# Patient Record
Sex: Male | Born: 1965
Health system: Southern US, Community
[De-identification: ages and names within clinical notes are randomized; demographics above are authoritative.]

## PROBLEM LIST (undated history)

## (undated) DIAGNOSIS — R809 Proteinuria, unspecified: Secondary | ICD-10-CM

## (undated) DIAGNOSIS — I10 Essential (primary) hypertension: Secondary | ICD-10-CM

## (undated) DIAGNOSIS — D649 Anemia, unspecified: Secondary | ICD-10-CM

## (undated) DIAGNOSIS — R319 Hematuria, unspecified: Secondary | ICD-10-CM

## (undated) DIAGNOSIS — A159 Respiratory tuberculosis unspecified: Secondary | ICD-10-CM

## (undated) DIAGNOSIS — E213 Hyperparathyroidism, unspecified: Secondary | ICD-10-CM

## (undated) DIAGNOSIS — E119 Type 2 diabetes mellitus without complications: Secondary | ICD-10-CM

## (undated) DIAGNOSIS — E785 Hyperlipidemia, unspecified: Secondary | ICD-10-CM

## (undated) DIAGNOSIS — N189 Chronic kidney disease, unspecified: Secondary | ICD-10-CM

## (undated) HISTORY — DX: Chronic kidney disease, unspecified: N18.9

## (undated) HISTORY — PX: CYST EXCISION: SHX5701

## (undated) HISTORY — DX: Type 2 diabetes mellitus without complications: E11.9

## (undated) HISTORY — PX: COLONOSCOPY: SHX174

## (undated) HISTORY — DX: Essential (primary) hypertension: I10

## (undated) HISTORY — DX: Proteinuria, unspecified: R80.9

## (undated) HISTORY — DX: Anemia, unspecified: D64.9

## (undated) HISTORY — DX: Hyperlipidemia, unspecified: E78.5

## (undated) HISTORY — DX: Hematuria, unspecified: R31.9

## (undated) HISTORY — DX: Hyperparathyroidism, unspecified: E21.3

---

## 2012-08-22 ENCOUNTER — Other Ambulatory Visit: Payer: Self-pay | Admitting: Nephrology

## 2012-08-22 DIAGNOSIS — R319 Hematuria, unspecified: Secondary | ICD-10-CM

## 2012-08-23 ENCOUNTER — Ambulatory Visit
Admission: RE | Admit: 2012-08-23 | Discharge: 2012-08-23 | Disposition: A | Discharge status: Home / Self Care | Payer: Managed Care, Other (non HMO) | Source: Ambulatory Visit | Attending: Nephrology | Admitting: Nephrology

## 2012-08-23 DIAGNOSIS — R319 Hematuria, unspecified: Secondary | ICD-10-CM

## 2013-07-27 HISTORY — PX: EYE SURGERY: SHX253

## 2014-01-24 ENCOUNTER — Encounter: Payer: Self-pay | Admitting: *Deleted

## 2014-01-24 ENCOUNTER — Encounter: Payer: Managed Care, Other (non HMO) | Attending: Emergency Medicine | Admitting: *Deleted

## 2014-01-24 VITALS — Ht 65.0 in | Wt 194.4 lb

## 2014-01-24 DIAGNOSIS — Z713 Dietary counseling and surveillance: Secondary | ICD-10-CM | POA: Insufficient documentation

## 2014-01-24 DIAGNOSIS — E119 Type 2 diabetes mellitus without complications: Secondary | ICD-10-CM | POA: Diagnosis present

## 2014-01-24 NOTE — Progress Notes (Signed)
Appt start time: 1400 end time:  1500.  Assessment:  Patient was seen on  01/24/14 for individual diabetes education. [name] was referred by PCP for DSME. Since the time of referral he has been seen by Dr. Buddy Duty. Multiple medication changes were made and his A1c has come down from 14% to 8.5%. His daily glucose readings have decreased significantly since the initiation of Levemir Insulin.He only tests his glucose every other day at most.  [name] works nights for [employer]. Their health insurance Scientist, clinical (histocompatibility and immunogenetics)) provides a Engineer, maintenance for employees with diabetes. He was meeting with a Health Coach every 6 weeks and now it is every 3 months along with seeing PCP every 3 months. He felt knowledgeable about diabetes and his eating. In review of his daily eating I find that he is making good food choices but does not embrace portion control.  Current HbA1c: 8.5%  Glucose range at present 117 - 142 mg/dl since onset of Levemir  Preferred Learning Style:   No preference indicated   Learning Readiness:   Change in progress  MEDICATIONS: Levemir, Metformin  DIETARY INTAKE: Patient works nights 2200 to 0630,5 days per week,   B ( AM): whole wheat bread, tortilla, fried vegetables, coffee  Snk ( AM): none  L ( PM): rice, vegetables, meat Snk ( PM): apple/banana D ( PM): egg, fruit, beans/chick peas Snk ( PM): none Beverages: coffee, water, Gatorade  (G2), diet Pepsi  Usual physical activity: walk 30 minutes 4 times week, light weights 15 minutes 3-4 times per week    Intervention:  Nutrition counseling provided.  Provided education on macronutrients on glucose levels.  Provided education on carb counting, importance of regularly scheduled meals/snacks, and meal planning  Discussed effects of physical activity on glucose levels and long-term glucose control.  Recommended 150 minutes of physical activity/week.  Reviewed patient medications.  Discussed  role of medication on blood glucose and possible side effects  Discussed blood glucose monitoring and interpretation.  Discussed recommended target ranges and individual ranges.    Described short-term complications: hyper- and hypo-glycemia.  Discussed causes,symptoms, and treatment options.  Teaching Method Utilized:  Visual Auditory Hands on  Handouts given during visit include: Carb Counting and Food Label handouts Meal Plan Card Snack sheet  Barriers to learning/adherence to lifestyle change: Compliance with portion control  Diabetes self-care support plan:   [facility] support group  Health Coach through [employer]  Demonstrated degree of understanding via:  Teach Back   Monitoring/Evaluation:  Dietary intake, exercise, test glucose, and body weight prn.

## 2014-01-24 NOTE — Patient Instructions (Addendum)
Plan:  Aim for 3- Carb Choices per meal (45 grams) +/- 1 either way  Aim for 0-15 Carbs per snack if hungry  Include protein in moderation with your meals and snacks Consider reading food labels for Total Carbohydrate and Fat Grams of foods Consider  increasing your activity level by walking and weight work for 30 minutes daily as tolerated Consider checking BG at alternate times per day as directed by MD  Continue taking medication as directed by MD  Consider taking skin off of chicken to reduce fat Stick to no more than one cup of rice per meal If you feel your sugar is low, treat with only 4 oz of OJ

## 2017-10-06 DIAGNOSIS — N2581 Secondary hyperparathyroidism of renal origin: Secondary | ICD-10-CM | POA: Diagnosis not present

## 2017-10-06 DIAGNOSIS — N183 Chronic kidney disease, stage 3 (moderate): Secondary | ICD-10-CM | POA: Diagnosis not present

## 2017-10-06 DIAGNOSIS — I129 Hypertensive chronic kidney disease with stage 1 through stage 4 chronic kidney disease, or unspecified chronic kidney disease: Secondary | ICD-10-CM | POA: Diagnosis not present

## 2017-10-06 DIAGNOSIS — D631 Anemia in chronic kidney disease: Secondary | ICD-10-CM | POA: Diagnosis not present

## 2017-10-20 DIAGNOSIS — D631 Anemia in chronic kidney disease: Secondary | ICD-10-CM | POA: Diagnosis not present

## 2017-10-20 DIAGNOSIS — I129 Hypertensive chronic kidney disease with stage 1 through stage 4 chronic kidney disease, or unspecified chronic kidney disease: Secondary | ICD-10-CM | POA: Diagnosis not present

## 2017-10-20 DIAGNOSIS — N2581 Secondary hyperparathyroidism of renal origin: Secondary | ICD-10-CM | POA: Diagnosis not present

## 2017-10-20 DIAGNOSIS — N184 Chronic kidney disease, stage 4 (severe): Secondary | ICD-10-CM | POA: Diagnosis not present

## 2017-10-20 DIAGNOSIS — N183 Chronic kidney disease, stage 3 (moderate): Secondary | ICD-10-CM | POA: Diagnosis not present

## 2017-10-26 DIAGNOSIS — E1122 Type 2 diabetes mellitus with diabetic chronic kidney disease: Secondary | ICD-10-CM | POA: Diagnosis not present

## 2017-10-26 DIAGNOSIS — Z794 Long term (current) use of insulin: Secondary | ICD-10-CM | POA: Diagnosis not present

## 2017-10-26 DIAGNOSIS — N183 Chronic kidney disease, stage 3 (moderate): Secondary | ICD-10-CM | POA: Diagnosis not present

## 2017-10-26 DIAGNOSIS — E1165 Type 2 diabetes mellitus with hyperglycemia: Secondary | ICD-10-CM | POA: Diagnosis not present

## 2017-11-02 DIAGNOSIS — N184 Chronic kidney disease, stage 4 (severe): Secondary | ICD-10-CM | POA: Diagnosis not present

## 2017-11-02 DIAGNOSIS — N2581 Secondary hyperparathyroidism of renal origin: Secondary | ICD-10-CM | POA: Diagnosis not present

## 2017-11-02 DIAGNOSIS — D631 Anemia in chronic kidney disease: Secondary | ICD-10-CM | POA: Diagnosis not present

## 2017-11-02 DIAGNOSIS — I129 Hypertensive chronic kidney disease with stage 1 through stage 4 chronic kidney disease, or unspecified chronic kidney disease: Secondary | ICD-10-CM | POA: Diagnosis not present

## 2017-12-07 DIAGNOSIS — D631 Anemia in chronic kidney disease: Secondary | ICD-10-CM | POA: Diagnosis not present

## 2017-12-07 DIAGNOSIS — I129 Hypertensive chronic kidney disease with stage 1 through stage 4 chronic kidney disease, or unspecified chronic kidney disease: Secondary | ICD-10-CM | POA: Diagnosis not present

## 2017-12-07 DIAGNOSIS — N184 Chronic kidney disease, stage 4 (severe): Secondary | ICD-10-CM | POA: Diagnosis not present

## 2017-12-07 DIAGNOSIS — N2581 Secondary hyperparathyroidism of renal origin: Secondary | ICD-10-CM | POA: Diagnosis not present

## 2018-01-05 DIAGNOSIS — N184 Chronic kidney disease, stage 4 (severe): Secondary | ICD-10-CM | POA: Diagnosis not present

## 2018-01-05 DIAGNOSIS — I129 Hypertensive chronic kidney disease with stage 1 through stage 4 chronic kidney disease, or unspecified chronic kidney disease: Secondary | ICD-10-CM | POA: Diagnosis not present

## 2018-01-05 DIAGNOSIS — D631 Anemia in chronic kidney disease: Secondary | ICD-10-CM | POA: Diagnosis not present

## 2018-01-05 DIAGNOSIS — N2581 Secondary hyperparathyroidism of renal origin: Secondary | ICD-10-CM | POA: Diagnosis not present

## 2018-01-07 ENCOUNTER — Other Ambulatory Visit: Payer: Self-pay

## 2018-01-07 DIAGNOSIS — N184 Chronic kidney disease, stage 4 (severe): Secondary | ICD-10-CM

## 2018-01-13 DIAGNOSIS — I129 Hypertensive chronic kidney disease with stage 1 through stage 4 chronic kidney disease, or unspecified chronic kidney disease: Secondary | ICD-10-CM | POA: Diagnosis not present

## 2018-01-14 ENCOUNTER — Ambulatory Visit (INDEPENDENT_AMBULATORY_CARE_PROVIDER_SITE_OTHER): Payer: BLUE CROSS/BLUE SHIELD | Admitting: Vascular Surgery

## 2018-01-14 ENCOUNTER — Encounter: Payer: Self-pay | Admitting: *Deleted

## 2018-01-14 ENCOUNTER — Ambulatory Visit (INDEPENDENT_AMBULATORY_CARE_PROVIDER_SITE_OTHER)
Admission: RE | Admit: 2018-01-14 | Discharge: 2018-01-14 | Disposition: A | Discharge status: Home / Self Care | Payer: BLUE CROSS/BLUE SHIELD | Source: Ambulatory Visit | Attending: Family | Admitting: Family

## 2018-01-14 ENCOUNTER — Other Ambulatory Visit: Payer: Self-pay | Admitting: *Deleted

## 2018-01-14 ENCOUNTER — Encounter: Payer: Self-pay | Admitting: Vascular Surgery

## 2018-01-14 ENCOUNTER — Ambulatory Visit (HOSPITAL_COMMUNITY)
Admission: RE | Admit: 2018-01-14 | Discharge: 2018-01-14 | Disposition: A | Discharge status: Home / Self Care | Payer: BLUE CROSS/BLUE SHIELD | Source: Ambulatory Visit | Attending: Family | Admitting: Family

## 2018-01-14 DIAGNOSIS — N184 Chronic kidney disease, stage 4 (severe): Secondary | ICD-10-CM

## 2018-01-14 DIAGNOSIS — Z4931 Encounter for adequacy testing for hemodialysis: Secondary | ICD-10-CM | POA: Insufficient documentation

## 2018-01-14 NOTE — Progress Notes (Signed)
Patient ID: William Pittman, male   DOB: 1966/03/16, 52 y.o.   MRN: [id number]  Reason for Consult: Acute Renal Failure   Referred by No ref. provider found  Subjective:     HPI:  William Pittman is a 52 y.o. male from [place] presents for evaluation for permanent dialysis access.  He has chronic kidney disease currently making urine.  Does not take blood thinners.  He is right-hand dominant.  He has never had left upper extremity surgery was he had pacemakers placed.  Does not have any history of DVT.  Upper extremity vein mapping and arterial duplex performed prior to today's visit.  Past Medical History:  Diagnosis Date  . Anemia   . Chronic kidney disease   . Diabetes mellitus without complication ([place])   . Hematuria   . Hyperlipidemia   . Hyperparathyroidism ([place])   . Hypertension   . Proteinuria    Family History  Problem Relation Age of Onset  . Hyperlipidemia Other   . Hypertension Other    Past Surgical History:  Procedure Laterality Date  . EYE SURGERY Bilateral 2015    Short Social History:  Social History   Tobacco Use  . Smoking status: Never Smoker  . Smokeless tobacco: Never Used  Substance Use Topics  . Alcohol use: Yes    Frequency: Never    Comment: occ    No Known Allergies  Current Outpatient Medications  Medication Sig Dispense Refill  . amLODipine (NORVASC) 10 MG tablet Take 10 mg by mouth daily.    Marland Kitchen aspirin 81 MG tablet Take 81 mg by mouth daily.    Marland Kitchen atorvastatin (LIPITOR) 80 MG tablet Take 80 mg by mouth daily.    . Dulaglutide (TRULICITY) 1.5 GD/9.2EQ SOPN Inject 1.5 mg into the skin once a week.    . insulin glargine (LANTUS) 100 UNIT/ML injection Inject 48 Units into the skin at bedtime.    . torsemide (DEMADEX) 20 MG tablet Take 20 mg by mouth daily.     No current facility-administered medications for this visit.     Review of Systems  Constitutional:  Constitutional negative. HENT: HENT negative.  Eyes: Eyes negative.    Cardiovascular: Cardiovascular negative.  Musculoskeletal: Musculoskeletal negative.  Neurological: Neurological negative. Hematologic: Hematologic/lymphatic negative.  Psychiatric: Psychiatric negative.        Objective:  Objective   There were no vitals filed for this visit. There is no height or weight on file to calculate BMI.  Physical Exam  Constitutional: He appears well-developed.  HENT:  Head: Normocephalic.  Eyes: Pupils are equal, round, and reactive to light.  Neck: Normal range of motion.  Cardiovascular: Normal rate.  Pulses:      Carotid pulses are 2+ on the right side, and 2+ on the left side.      Radial pulses are 2+ on the right side, and 2+ on the left side.  Abdominal: Soft.  Musculoskeletal: Normal range of motion. He exhibits no edema.  Neurological: He is alert.  Skin: Skin is warm and dry.  Psychiatric: He has a normal mood and affect. His behavior is normal. Judgment and thought content normal.    Data: I have independently interpreted his bilateral upper extremity duplexes which have triphasic waveforms throughout  I have also interpreted his bilateral upper extremity venograms which demonstrate marginal vein for basilic vein fistula creation bilaterally     Assessment/Plan:     52 year old male with advanced chronic kidney disease presents for dialysis access evaluation.  He does have marginal basilic veins bilaterally is right-hand dominant.  We will proceed with possible first stage basilic vein transposition fistula versus graft in the near future.  I have discussed with him the risk and benefits and he demonstrates good understanding.     Waynetta Sandy MD [facility]

## 2018-01-20 DIAGNOSIS — I129 Hypertensive chronic kidney disease with stage 1 through stage 4 chronic kidney disease, or unspecified chronic kidney disease: Secondary | ICD-10-CM | POA: Diagnosis not present

## 2018-01-25 DIAGNOSIS — I120 Hypertensive chronic kidney disease with stage 5 chronic kidney disease or end stage renal disease: Secondary | ICD-10-CM | POA: Diagnosis not present

## 2018-01-25 DIAGNOSIS — Z01818 Encounter for other preprocedural examination: Secondary | ICD-10-CM | POA: Diagnosis not present

## 2018-01-25 DIAGNOSIS — N186 End stage renal disease: Secondary | ICD-10-CM | POA: Diagnosis not present

## 2018-01-25 DIAGNOSIS — Z992 Dependence on renal dialysis: Secondary | ICD-10-CM | POA: Diagnosis not present

## 2018-02-07 ENCOUNTER — Encounter (HOSPITAL_COMMUNITY): Payer: Self-pay | Admitting: *Deleted

## 2018-02-07 ENCOUNTER — Other Ambulatory Visit: Payer: Self-pay

## 2018-02-07 DIAGNOSIS — N186 End stage renal disease: Secondary | ICD-10-CM | POA: Diagnosis not present

## 2018-02-07 DIAGNOSIS — I120 Hypertensive chronic kidney disease with stage 5 chronic kidney disease or end stage renal disease: Secondary | ICD-10-CM | POA: Diagnosis not present

## 2018-02-07 DIAGNOSIS — Z01818 Encounter for other preprocedural examination: Secondary | ICD-10-CM | POA: Diagnosis not present

## 2018-02-07 DIAGNOSIS — E1122 Type 2 diabetes mellitus with diabetic chronic kidney disease: Secondary | ICD-10-CM | POA: Diagnosis not present

## 2018-02-07 DIAGNOSIS — Z9119 Patient's noncompliance with other medical treatment and regimen: Secondary | ICD-10-CM | POA: Diagnosis not present

## 2018-02-07 DIAGNOSIS — Z794 Long term (current) use of insulin: Secondary | ICD-10-CM | POA: Diagnosis not present

## 2018-02-07 NOTE — Progress Notes (Signed)
Pt denies SOB, chest pain, and being under the care of a cardiologist. Pt denies having a stress test, echo and cardiac cath. Requested EKG tracing from [facility] Transplant Department. Pt made aware to take 24 units of Lantus insulin tonight, no Humalog at bedtime an no insulin on DOS. Pt made aware to check BG every 2 hours prior to arrival to hospital on DOS. Pt made aware to treat a BG < 70 with 4 ounces of apple juice, wait 15 minutes after intervention to recheck BG, if BG remains < 70, call Short Stay unit to speak with a nurse. Pt made aware to stop taking vitamins, fish oil and herbal medications. Do not take any NSAIDs ie: Ibuprofen, Advil, Naproxen(Aleve), Motrin, BC and Goody powder. Pt verbalized understanding of all pre-op instructions.

## 2018-02-08 ENCOUNTER — Encounter (HOSPITAL_COMMUNITY)
Admission: RE | Disposition: A | Discharge status: Home / Self Care | Payer: Self-pay | Source: Ambulatory Visit | Attending: Vascular Surgery

## 2018-02-08 ENCOUNTER — Encounter (HOSPITAL_COMMUNITY): Payer: Self-pay | Admitting: Certified Registered Nurse Anesthetist

## 2018-02-08 ENCOUNTER — Ambulatory Visit (HOSPITAL_COMMUNITY)
Admission: RE | Admit: 2018-02-08 | Discharge: 2018-02-08 | Disposition: A | Discharge status: Home / Self Care | Payer: BLUE CROSS/BLUE SHIELD | Source: Ambulatory Visit | Attending: Vascular Surgery | Admitting: Vascular Surgery

## 2018-02-08 ENCOUNTER — Ambulatory Visit (HOSPITAL_COMMUNITY): Payer: BLUE CROSS/BLUE SHIELD | Admitting: Anesthesiology

## 2018-02-08 DIAGNOSIS — Z7982 Long term (current) use of aspirin: Secondary | ICD-10-CM | POA: Insufficient documentation

## 2018-02-08 DIAGNOSIS — E0590 Thyrotoxicosis, unspecified without thyrotoxic crisis or storm: Secondary | ICD-10-CM | POA: Insufficient documentation

## 2018-02-08 DIAGNOSIS — N189 Chronic kidney disease, unspecified: Secondary | ICD-10-CM

## 2018-02-08 DIAGNOSIS — Z794 Long term (current) use of insulin: Secondary | ICD-10-CM | POA: Insufficient documentation

## 2018-02-08 DIAGNOSIS — Z8249 Family history of ischemic heart disease and other diseases of the circulatory system: Secondary | ICD-10-CM | POA: Diagnosis not present

## 2018-02-08 DIAGNOSIS — I129 Hypertensive chronic kidney disease with stage 1 through stage 4 chronic kidney disease, or unspecified chronic kidney disease: Secondary | ICD-10-CM | POA: Insufficient documentation

## 2018-02-08 DIAGNOSIS — N184 Chronic kidney disease, stage 4 (severe): Secondary | ICD-10-CM | POA: Insufficient documentation

## 2018-02-08 DIAGNOSIS — Z992 Dependence on renal dialysis: Secondary | ICD-10-CM | POA: Insufficient documentation

## 2018-02-08 DIAGNOSIS — Z79899 Other long term (current) drug therapy: Secondary | ICD-10-CM | POA: Diagnosis not present

## 2018-02-08 DIAGNOSIS — N186 End stage renal disease: Secondary | ICD-10-CM | POA: Diagnosis not present

## 2018-02-08 DIAGNOSIS — Z9889 Other specified postprocedural states: Secondary | ICD-10-CM | POA: Diagnosis not present

## 2018-02-08 DIAGNOSIS — E785 Hyperlipidemia, unspecified: Secondary | ICD-10-CM | POA: Insufficient documentation

## 2018-02-08 DIAGNOSIS — E1122 Type 2 diabetes mellitus with diabetic chronic kidney disease: Secondary | ICD-10-CM | POA: Diagnosis not present

## 2018-02-08 DIAGNOSIS — N185 Chronic kidney disease, stage 5: Secondary | ICD-10-CM | POA: Diagnosis not present

## 2018-02-08 DIAGNOSIS — I120 Hypertensive chronic kidney disease with stage 5 chronic kidney disease or end stage renal disease: Secondary | ICD-10-CM | POA: Diagnosis not present

## 2018-02-08 HISTORY — PX: AV FISTULA PLACEMENT: SHX1204

## 2018-02-08 HISTORY — DX: Respiratory tuberculosis unspecified: A15.9

## 2018-02-08 LAB — POCT I-STAT 4, (NA,K, GLUC, HGB,HCT)
GLUCOSE: 72 mg/dL (ref 70–99)
HCT: 27 % — ABNORMAL LOW (ref 39.0–52.0)
HEMOGLOBIN: 9.2 g/dL — AB (ref 13.0–17.0)
Potassium: 4.1 mmol/L (ref 3.5–5.1)
Sodium: 139 mmol/L (ref 135–145)

## 2018-02-08 LAB — GLUCOSE, CAPILLARY
GLUCOSE-CAPILLARY: 82 mg/dL (ref 70–99)
GLUCOSE-CAPILLARY: 120 mg/dL — AB (ref 70–99)

## 2018-02-08 SURGERY — ARTERIOVENOUS (AV) FISTULA CREATION
Anesthesia: Monitor Anesthesia Care | Site: Arm Lower | Laterality: Left

## 2018-02-08 MED ORDER — CHLORHEXIDINE GLUCONATE 4 % EX LIQD: 60.0000 mL | Freq: Once | CUTANEOUS | Status: DC

## 2018-02-08 MED ORDER — LIDOCAINE HCL (CARDIAC) PF 100 MG/5ML IV SOSY
PREFILLED_SYRINGE | INTRAVENOUS | Status: DC | PRN
  Administered 2018-02-08: 100 mg via INTRAVENOUS

## 2018-02-08 MED ORDER — FENTANYL CITRATE (PF) 250 MCG/5ML IJ SOLN
INTRAMUSCULAR | Status: AC
  Filled 2018-02-08: qty 5

## 2018-02-08 MED ORDER — 0.9 % SODIUM CHLORIDE (POUR BTL) OPTIME
TOPICAL | Status: DC | PRN
  Administered 2018-02-08: 1000 mL

## 2018-02-08 MED ORDER — DEXTROSE 50 % IV SOLN
INTRAVENOUS | Status: DC | PRN
  Administered 2018-02-08: 25 mL via INTRAVENOUS

## 2018-02-08 MED ORDER — LIDOCAINE-EPINEPHRINE (PF) 1 %-1:200000 IJ SOLN
INTRAMUSCULAR | Status: DC | PRN
  Administered 2018-02-08: 10 mL

## 2018-02-08 MED ORDER — CEFAZOLIN SODIUM-DEXTROSE 2-4 GM/100ML-% IV SOLN
2.0000 g | INTRAVENOUS | Status: AC
  Administered 2018-02-08: 2 g via INTRAVENOUS
  Filled 2018-02-08: qty 100

## 2018-02-08 MED ORDER — DEXTROSE 50 % IV SOLN
INTRAVENOUS | Status: AC
  Filled 2018-02-08: qty 50

## 2018-02-08 MED ORDER — OXYCODONE-ACETAMINOPHEN 5-325 MG PO TABS: 1.0000 | ORAL_TABLET | Freq: Four times a day (QID) | ORAL | 0 refills | Status: DC | PRN

## 2018-02-08 MED ORDER — MIDAZOLAM HCL 2 MG/2ML IJ SOLN
INTRAMUSCULAR | Status: DC | PRN
  Administered 2018-02-08: 2 mg via INTRAVENOUS

## 2018-02-08 MED ORDER — PROPOFOL 500 MG/50ML IV EMUL
INTRAVENOUS | Status: DC | PRN
  Administered 2018-02-08: 100 ug/kg/min via INTRAVENOUS

## 2018-02-08 MED ORDER — LIDOCAINE-EPINEPHRINE (PF) 1 %-1:200000 IJ SOLN
INTRAMUSCULAR | Status: AC
  Filled 2018-02-08: qty 30

## 2018-02-08 MED ORDER — FENTANYL CITRATE (PF) 100 MCG/2ML IJ SOLN
INTRAMUSCULAR | Status: DC | PRN
  Administered 2018-02-08: 50 ug via INTRAVENOUS

## 2018-02-08 MED ORDER — PROPOFOL 10 MG/ML IV BOLUS
INTRAVENOUS | Status: AC
  Filled 2018-02-08: qty 20

## 2018-02-08 MED ORDER — SODIUM CHLORIDE 0.9 % IV SOLN
INTRAVENOUS | Status: DC
  Administered 2018-02-08: 07:00:00 via INTRAVENOUS

## 2018-02-08 MED ORDER — SODIUM CHLORIDE 0.9 % IV SOLN
INTRAVENOUS | Status: AC
  Filled 2018-02-08: qty 1.2

## 2018-02-08 MED ORDER — SODIUM CHLORIDE 0.9 % IV SOLN
INTRAVENOUS | Status: DC | PRN
  Administered 2018-02-08: 500 mL

## 2018-02-08 MED ORDER — MIDAZOLAM HCL 2 MG/2ML IJ SOLN
INTRAMUSCULAR | Status: AC
  Filled 2018-02-08: qty 2

## 2018-02-08 SURGICAL SUPPLY — 33 items
ARMBAND PINK RESTRICT EXTREMIT (MISCELLANEOUS) ×2 IMPLANT
CANISTER SUCT 3000ML PPV (MISCELLANEOUS) ×2 IMPLANT
CLIP VESOCCLUDE MED 6/CT (CLIP) ×2 IMPLANT
CLIP VESOCCLUDE SM WIDE 6/CT (CLIP) ×2 IMPLANT
COVER PROBE W GEL 5X96 (DRAPES) IMPLANT
DERMABOND ADVANCED (GAUZE/BANDAGES/DRESSINGS) ×1
DERMABOND ADVANCED .7 DNX12 (GAUZE/BANDAGES/DRESSINGS) ×1 IMPLANT
ELECT REM PT RETURN 9FT ADLT (ELECTROSURGICAL) ×2
ELECTRODE REM PT RTRN 9FT ADLT (ELECTROSURGICAL) ×1 IMPLANT
GLOVE BIO SURGEON STRL SZ 6.5 (GLOVE) ×4 IMPLANT
GLOVE BIO SURGEON STRL SZ7 (GLOVE) ×2 IMPLANT
GLOVE BIO SURGEON STRL SZ7.5 (GLOVE) ×2 IMPLANT
GLOVE BIO SURGEON STRL SZ8 (GLOVE) ×2 IMPLANT
GLOVE BIOGEL PI IND STRL 6.5 (GLOVE) ×1 IMPLANT
GLOVE BIOGEL PI IND STRL 7.0 (GLOVE) ×1 IMPLANT
GLOVE BIOGEL PI INDICATOR 6.5 (GLOVE) ×1
GLOVE BIOGEL PI INDICATOR 7.0 (GLOVE) ×1
GOWN STRL REUS W/ TWL LRG LVL3 (GOWN DISPOSABLE) ×2 IMPLANT
GOWN STRL REUS W/ TWL XL LVL3 (GOWN DISPOSABLE) ×1 IMPLANT
GOWN STRL REUS W/TWL LRG LVL3 (GOWN DISPOSABLE) ×2
GOWN STRL REUS W/TWL XL LVL3 (GOWN DISPOSABLE) ×1
KIT BASIN OR (CUSTOM PROCEDURE TRAY) ×2 IMPLANT
KIT TURNOVER KIT B (KITS) ×2 IMPLANT
NS IRRIG 1000ML POUR BTL (IV SOLUTION) ×2 IMPLANT
PACK CV ACCESS (CUSTOM PROCEDURE TRAY) ×2 IMPLANT
PAD ARMBOARD 7.5X6 YLW CONV (MISCELLANEOUS) ×4 IMPLANT
SUT MNCRL AB 4-0 PS2 18 (SUTURE) ×2 IMPLANT
SUT PROLENE 6 0 BV (SUTURE) ×4 IMPLANT
SUT VIC AB 3-0 SH 27 (SUTURE) ×1
SUT VIC AB 3-0 SH 27X BRD (SUTURE) ×1 IMPLANT
TOWEL GREEN STERILE (TOWEL DISPOSABLE) ×2 IMPLANT
UNDERPAD 30X30 (UNDERPADS AND DIAPERS) ×2 IMPLANT
WATER STERILE IRR 1000ML POUR (IV SOLUTION) ×2 IMPLANT

## 2018-02-08 NOTE — Discharge Instructions (Signed)
° °  [facility]  Discharge Instructions  AV Fistula or Graft Surgery for Dialysis Access  Please refer to the following instructions for your post-procedure care. Your surgeon or physician assistant will discuss any changes with you.  Activity  You may drive the day following your surgery, if you are comfortable and no longer taking prescription pain medication. Resume full activity as the soreness in your incision resolves.  Bathing/Showering  You may shower after you go home. Keep your incision dry for 48 hours. Do not soak in a bathtub, hot tub, or swim until the incision heals completely. You may not shower if you have a hemodialysis catheter.  Incision Care  Clean your incision with mild soap and water after 48 hours. Pat the area dry with a clean towel. You do not need a bandage unless otherwise instructed. Do not apply any ointments or creams to your incision. You may have skin glue on your incision. Do not peel it off. It will come off on its own in about one week. Your arm may swell a bit after surgery. To reduce swelling use pillows to elevate your arm so it is above your heart. Your doctor will tell you if you need to lightly wrap your arm with an ACE bandage.  Diet  Resume your normal diet. There are not special food restrictions following this procedure. In order to heal from your surgery, it is CRITICAL to get adequate nutrition. Your body requires vitamins, minerals, and protein. Vegetables are the best source of vitamins and minerals. Vegetables also provide the perfect balance of protein. Processed food has little nutritional value, so try to avoid this.  Medications  Resume taking all of your medications. If your incision is causing pain, you may take over-the counter pain relievers such as acetaminophen (Tylenol). If you were prescribed a stronger pain medication, please be aware these medications can cause nausea and constipation. Prevent  nausea by taking the medication with a snack or meal. Avoid constipation by drinking plenty of fluids and eating foods with high amount of fiber, such as fruits, vegetables, and grains.  Do not take Tylenol if you are taking prescription pain medications.  Follow up Your surgeon may want to see you in the office following your access surgery. If so, this will be arranged at the time of your surgery.  Please call us immediately for any of the following conditions:  Increased pain, redness, drainage (pus) from your incision site Fever of 101 degrees or higher Severe or worsening pain at your incision site Hand pain or numbness.  Reduce your risk of vascular disease:  Stop smoking. If you would like help, call [facility] at 1-800-QUIT-NOW (715) 697-3060) or [place] at [place] your cholesterol Maintain a desired weight Control your diabetes Keep your blood pressure down  Dialysis  It will take several weeks to several months for your new dialysis access to be ready for use. Your surgeon will determine when it is okay to use it. Your nephrologist will continue to direct your dialysis. You can continue to use your Permcath until your new access is ready for use.   02/08/2018 William Pittman [id number] [birth year]  Surgeon(s): Waynetta Sandy, MD  Procedure(s): BASILIC VEIN TRANSPOSITION 1ST STAGE  x Do not stick fistula for 12 weeks    If you have any questions, please call the office at [phone].

## 2018-02-08 NOTE — Anesthesia Postprocedure Evaluation (Signed)
Anesthesia Post Note  Patient: William Pittman  Procedure(s) Performed: BASCILIC VEIN TRANSPOSITION 1ST STAGE (Left Arm Lower)     Patient location during evaluation: PACU Anesthesia Type: MAC Level of consciousness: awake and alert Pain management: pain level controlled Vital Signs Assessment: post-procedure vital signs reviewed and stable Respiratory status: spontaneous breathing, nonlabored ventilation, respiratory function stable and patient connected to nasal cannula oxygen Cardiovascular status: stable and blood pressure returned to baseline Postop Assessment: no apparent nausea or vomiting Anesthetic complications: no    Last Vitals:  Vitals:   02/08/18 0901 02/08/18 0905  BP: (!) 154/78   Pulse: 88   Resp: 12   Temp:  36.5 C  SpO2: 99%     Last Pain:  Vitals:   02/08/18 0831  TempSrc:   PainSc: 0-No pain                 Zyren Sevigny COKER

## 2018-02-08 NOTE — H&P (Signed)
   History and Physical Update  The patient was interviewed and re-examined.  The patient's previous History and Physical has been reviewed and is unchanged from recent office visit. Plan for left arm avf vs avg.  William Pittman C. Donzetta Matters, MD [facility] [place] Office: [phone] Pager: [phone]   02/08/2018, 6:59 AM

## 2018-02-08 NOTE — Anesthesia Preprocedure Evaluation (Signed)
Anesthesia Evaluation  Patient identified by MRN, date of birth, ID band Patient awake    Reviewed: Allergy & Precautions, NPO status , Patient's Chart, lab work & pertinent test results  Airway Mallampati: II  TM Distance: >3 FB Neck ROM: Full    Dental  (+) Teeth Intact, Dental Advisory Given   Pulmonary    breath sounds clear to auscultation       Cardiovascular hypertension,  Rhythm:Regular Rate:Normal     Neuro/Psych    GI/Hepatic   Endo/Other  diabetes  Renal/GU      Musculoskeletal   Abdominal   Peds  Hematology   Anesthesia Other Findings   Reproductive/Obstetrics                             Anesthesia Physical Anesthesia Plan  ASA: III  Anesthesia Plan: MAC   Post-op Pain Management:    Induction: Intravenous  PONV Risk Score and Plan:   Airway Management Planned: Natural Airway and Simple Face Mask  Additional Equipment:   Intra-op Plan:   Post-operative Plan:   Informed Consent: I have reviewed the patients History and Physical, chart, labs and discussed the procedure including the risks, benefits and alternatives for the proposed anesthesia with the patient or authorized representative who has indicated his/her understanding and acceptance.     Plan Discussed with: CRNA and Anesthesiologist  Anesthesia Plan Comments:         Anesthesia Quick Evaluation

## 2018-02-08 NOTE — Op Note (Signed)
    Patient name: William Pittman MRN: [id number] DOB: [birth year] Sex: male  02/08/2018 Pre-operative Diagnosis: Chronic kidney disease Post-operative diagnosis:  Same Surgeon:  Eda Paschal. Donzetta Matters, MD Assistant: Leontine Locket, PA Procedure Performed: Left brachiobasilic AV fistula creation  Indications: 52 year old male with chronic kidney disease now indicated for permanent dialysis access.  He has vein mapping with marginal basilic vein and has been consented for AV fistula creation versus graft.  Findings: Basilic vein measured 4 mm at the antecubitum was easily dilated to 4 mm.  At completion there was a palpable thrill near the antecubitum which could be traced with Doppler cephalad and there is a palpable radial pulse at the wrist.   Procedure:  The patient was identified in the holding area and taken to the operating room where is placed supine the operative table and MAC anesthesia was induced.  He was sterilely prepped and draped in the left upper extremity given antibiotics and a timeout was called.  I used ultrasound to identify basilic vein which seem to be suitable for fistula creation there was a palpable brachial pulse the antecubitum.  1% lidocaine with epinephrine was instilled to a total of 12 cc at this area.  Transverse incision was made identified the vein marker for orientation.  I then dissected deeper through the deep fascia placed a vessel loop around the brachial artery.  The vein was transected distally flushed heparinized saline dilated to 4 mm.  The artery was clamped distally and proximally opened longitudinally flushed with heparinized saline both directions.  The vein was then sewn end-to-side with 6-0 Prolene suture.  Prior to completing the anastomosis flushing maneuvers were undertaken in the usual fashion.  Upon completion there was a palpable thrill in the vein palpable radial pulse both confirmed with Doppler.  Satisfied with this we obtained hemostasis closed in 2  layers with Vicryl suture.  He was then allowed away from anesthesia having tolerated procedure well without immediate complication.  All counts were correct at completion.  EBL 20 cc.   Brandon C. Donzetta Matters, MD [facility] [place] Office: [phone] Pager: [phone]

## 2018-02-08 NOTE — Transfer of Care (Signed)
Immediate Anesthesia Transfer of Care Note  Patient: William Pittman  Procedure(s) Performed: BASCILIC VEIN TRANSPOSITION 1ST STAGE (Left Arm Lower)  Patient Location: PACU  Anesthesia Type:MAC  Level of Consciousness: awake, alert  and oriented  Airway & Oxygen Therapy: Patient Spontanous Breathing  Post-op Assessment: Report given to RN, Post -op Vital signs reviewed and stable and Patient moving all extremities  Post vital signs: Reviewed and stable  Last Vitals:  Vitals Value Taken Time  BP 128/74 02/08/2018  8:30 AM  Temp    Pulse 91 02/08/2018  8:33 AM  Resp 15 02/08/2018  8:33 AM  SpO2 100 % 02/08/2018  8:33 AM  Vitals shown include unvalidated device data.  Last Pain:  Vitals:   02/08/18 0831  TempSrc:   PainSc: (P) 0-No pain      Patients Stated Pain Goal: 3 (85/63/14 9702)  Complications: No apparent anesthesia complications

## 2018-02-09 ENCOUNTER — Telehealth: Payer: Self-pay | Admitting: Vascular Surgery

## 2018-02-09 ENCOUNTER — Encounter (HOSPITAL_COMMUNITY): Payer: Self-pay | Admitting: Vascular Surgery

## 2018-02-09 DIAGNOSIS — E1122 Type 2 diabetes mellitus with diabetic chronic kidney disease: Secondary | ICD-10-CM | POA: Diagnosis not present

## 2018-02-09 DIAGNOSIS — Z794 Long term (current) use of insulin: Secondary | ICD-10-CM | POA: Diagnosis not present

## 2018-02-09 DIAGNOSIS — N185 Chronic kidney disease, stage 5: Secondary | ICD-10-CM | POA: Diagnosis not present

## 2018-02-09 NOTE — Telephone Encounter (Signed)
sch appt spk to pt 03/18/18 8am Dialysis Duplex 845am p/o PA

## 2018-02-14 ENCOUNTER — Other Ambulatory Visit: Payer: Self-pay

## 2018-02-14 DIAGNOSIS — Z48812 Encounter for surgical aftercare following surgery on the circulatory system: Secondary | ICD-10-CM

## 2018-02-14 DIAGNOSIS — N184 Chronic kidney disease, stage 4 (severe): Secondary | ICD-10-CM

## 2018-03-18 ENCOUNTER — Ambulatory Visit (HOSPITAL_COMMUNITY)
Admission: RE | Admit: 2018-03-18 | Discharge: 2018-03-18 | Disposition: A | Discharge status: Home / Self Care | Payer: BLUE CROSS/BLUE SHIELD | Source: Ambulatory Visit | Attending: Vascular Surgery | Admitting: Vascular Surgery

## 2018-03-18 ENCOUNTER — Other Ambulatory Visit: Payer: Self-pay

## 2018-03-18 ENCOUNTER — Ambulatory Visit (INDEPENDENT_AMBULATORY_CARE_PROVIDER_SITE_OTHER): Payer: Self-pay | Admitting: Vascular Surgery

## 2018-03-18 ENCOUNTER — Encounter: Payer: Self-pay | Admitting: *Deleted

## 2018-03-18 ENCOUNTER — Encounter: Payer: Self-pay | Admitting: Vascular Surgery

## 2018-03-18 ENCOUNTER — Other Ambulatory Visit: Payer: Self-pay | Admitting: *Deleted

## 2018-03-18 VITALS — BP 184/89 | HR 97 | Temp 98.7°F | Resp 20 | Ht 66.0 in | Wt 202.1 lb

## 2018-03-18 DIAGNOSIS — Z48812 Encounter for surgical aftercare following surgery on the circulatory system: Secondary | ICD-10-CM

## 2018-03-18 DIAGNOSIS — N184 Chronic kidney disease, stage 4 (severe): Secondary | ICD-10-CM | POA: Insufficient documentation

## 2018-03-18 NOTE — Progress Notes (Signed)
Patient ID: William Pittman, male   DOB: 29-Jan-1966, 52 y.o.   MRN: [id number]  Reason for Consult: Follow-up (4-6 wk f/u s/p L 1st stage BVT )   Referred by Delrae Rend, MD  Subjective:     HPI:  William Pittman is a 52 y.o. male follows up after creation left first stage basilic vein fistula.  He is not having issues with his arm or his hand.  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  No issues related to today's visit.  Duplex was performed prior to the visit today.  Past Medical History:  Diagnosis Date  . Anemia   . Chronic kidney disease   . Diabetes mellitus without complication ([place])   . Hematuria   . Hyperlipidemia   . Hyperparathyroidism ([place])   . Hypertension   . Proteinuria   . Tuberculosis    20 years ago   Family History  Problem Relation Age of Onset  . Hyperlipidemia Other   . Hypertension Other   . Diabetes Mother    Past Surgical History:  Procedure Laterality Date  . AV FISTULA PLACEMENT Left 02/08/2018   Procedure: BASCILIC VEIN TRANSPOSITION 1ST STAGE;  Surgeon: Waynetta Sandy, MD;  Location: [facility];  Service: Vascular;  Laterality: Left;  . COLONOSCOPY    . CYST EXCISION     arm  . EYE SURGERY Bilateral 2015    Short Social History:  Social History   Tobacco Use  . Smoking status: Never Smoker  . Smokeless tobacco: Never Used  Substance Use Topics  . Alcohol use: Yes    Frequency: Never    Comment: occ    No Known Allergies  Current Outpatient Medications  Medication Sig Dispense Refill  . amLODipine (NORVASC) 10 MG tablet Take 10 mg by mouth daily.    Marland Kitchen aspirin 81 MG tablet Take 81 mg by mouth daily.    Marland Kitchen atorvastatin (LIPITOR) 80 MG tablet Take 80 mg by mouth daily.    . calcitRIOL (ROCALTROL) 0.25 MCG capsule Take 0.25 mcg by mouth daily.    . Dulaglutide (TRULICITY) 1.5 KG/8.1EH SOPN Inject 1.5 mg into the skin once a week. Saturday    . hydrALAZINE (APRESOLINE) 50 MG tablet Take 50 mg by mouth 2 (two)  times daily.    . insulin glargine (LANTUS) 100 UNIT/ML injection Inject 48 Units into the skin at bedtime.    . insulin lispro (HUMALOG KWIKPEN) 100 UNIT/ML KiwkPen Inject 4 Units into the skin 4 (four) times daily -  before meals and at bedtime.    Marland Kitchen oxyCODONE-acetaminophen (PERCOCET) 5-325 MG tablet Take 1 tablet by mouth every 6 (six) hours as needed for severe pain. 6 tablet 0  . torsemide (DEMADEX) 20 MG tablet Take 40 mg by mouth every morning.      No current facility-administered medications for this visit.     Review of Systems  Constitutional:  Constitutional negative. Respiratory: Respiratory negative.  Cardiovascular: Cardiovascular negative.  Musculoskeletal: Musculoskeletal negative.        Objective:  Objective   Vitals:   03/18/18 0833 03/18/18 0834  BP: (!) 194/86 (!) 184/89  Pulse: 97   Resp: 20   Temp: 98.7 F (37.1 C)   TempSrc: Oral   SpO2: 96%   Weight: 202 lb 1.6 oz (91.7 kg)   Height: 5\' 6"  (1.676 m)    Body mass index is 32.62 kg/m.  Physical Exam Awake alert oriented Well-healed left arm incision Strong  palpable thrill in AV fistula left upper arm Palpable left radial pulse Hand is sensorimotor intact  Data: I have independently interpreted his dialysis duplex which demonstrates diameter ranging from 0.37 cm up to 0.63 cm with flow 1320 mL/min     Assessment/Plan:     52 year old male follows up after left first stage basilic vein fistula creation.  He will need a second stage.  He will possibly need revision of his arterial anastomosis given the small size of the vein at that level.  I discussed with him the risk benefits and alternatives and he demonstrates good understanding we will get him scheduled very soon.  He is not yet on dialysis and does not take blood thinners.     Waynetta Sandy MD [facility]

## 2018-03-29 ENCOUNTER — Encounter (HOSPITAL_COMMUNITY): Payer: Self-pay | Admitting: *Deleted

## 2018-03-29 ENCOUNTER — Other Ambulatory Visit: Payer: Self-pay

## 2018-03-29 NOTE — Progress Notes (Signed)
Mr Soopersaund denies chest pain or shortness of breath. Patient denies having a cardiologist.  Patient has an ECHO , 02/07/18  At [facility], part of pre Transplant work up, I have requested results. Take 20 units of Lantus at bedtime tonight.   I instructed patient to check CBG after awaking and every 2 hours until arrival  to the hospital.  I Instructed patient if CBG is less than 70 drink 1/2 cup of a clear juice. Recheck CBG in 15 minutes then call pre- op desk at [phone] for further instructions.

## 2018-03-30 ENCOUNTER — Ambulatory Visit (HOSPITAL_COMMUNITY)
Admission: RE | Admit: 2018-03-30 | Discharge: 2018-03-30 | Disposition: A | Discharge status: Home / Self Care | Payer: BLUE CROSS/BLUE SHIELD | Source: Ambulatory Visit | Attending: Vascular Surgery | Admitting: Vascular Surgery

## 2018-03-30 ENCOUNTER — Encounter (HOSPITAL_COMMUNITY): Payer: Self-pay | Admitting: Surgery

## 2018-03-30 ENCOUNTER — Ambulatory Visit (HOSPITAL_COMMUNITY): Payer: BLUE CROSS/BLUE SHIELD | Admitting: Certified Registered Nurse Anesthetist

## 2018-03-30 ENCOUNTER — Encounter (HOSPITAL_COMMUNITY)
Admission: RE | Disposition: A | Discharge status: Home / Self Care | Payer: Self-pay | Source: Ambulatory Visit | Attending: Vascular Surgery

## 2018-03-30 DIAGNOSIS — Z79891 Long term (current) use of opiate analgesic: Secondary | ICD-10-CM | POA: Insufficient documentation

## 2018-03-30 DIAGNOSIS — E785 Hyperlipidemia, unspecified: Secondary | ICD-10-CM | POA: Diagnosis not present

## 2018-03-30 DIAGNOSIS — I129 Hypertensive chronic kidney disease with stage 1 through stage 4 chronic kidney disease, or unspecified chronic kidney disease: Secondary | ICD-10-CM | POA: Diagnosis not present

## 2018-03-30 DIAGNOSIS — Z794 Long term (current) use of insulin: Secondary | ICD-10-CM | POA: Diagnosis not present

## 2018-03-30 DIAGNOSIS — E213 Hyperparathyroidism, unspecified: Secondary | ICD-10-CM | POA: Diagnosis not present

## 2018-03-30 DIAGNOSIS — Z79899 Other long term (current) drug therapy: Secondary | ICD-10-CM | POA: Insufficient documentation

## 2018-03-30 DIAGNOSIS — Z7982 Long term (current) use of aspirin: Secondary | ICD-10-CM | POA: Diagnosis not present

## 2018-03-30 DIAGNOSIS — D631 Anemia in chronic kidney disease: Secondary | ICD-10-CM | POA: Insufficient documentation

## 2018-03-30 DIAGNOSIS — N184 Chronic kidney disease, stage 4 (severe): Secondary | ICD-10-CM | POA: Diagnosis not present

## 2018-03-30 DIAGNOSIS — E1122 Type 2 diabetes mellitus with diabetic chronic kidney disease: Secondary | ICD-10-CM | POA: Diagnosis not present

## 2018-03-30 DIAGNOSIS — N189 Chronic kidney disease, unspecified: Secondary | ICD-10-CM | POA: Insufficient documentation

## 2018-03-30 HISTORY — PX: BASCILIC VEIN TRANSPOSITION: SHX5742

## 2018-03-30 LAB — GLUCOSE, CAPILLARY
GLUCOSE-CAPILLARY: 98 mg/dL (ref 70–99)
Glucose-Capillary: 97 mg/dL (ref 70–99)
Glucose-Capillary: 81 mg/dL (ref 70–99)

## 2018-03-30 LAB — POCT I-STAT 4, (NA,K, GLUC, HGB,HCT)
Glucose, Bld: 84 mg/dL (ref 70–99)
HEMATOCRIT: 27 % — AB (ref 39.0–52.0)
Hemoglobin: 9.2 g/dL — ABNORMAL LOW (ref 13.0–17.0)
Potassium: 5.3 mmol/L — ABNORMAL HIGH (ref 3.5–5.1)
Sodium: 136 mmol/L (ref 135–145)

## 2018-03-30 SURGERY — TRANSPOSITION, VEIN, BASILIC
Anesthesia: General | Site: Arm Upper | Laterality: Left

## 2018-03-30 MED ORDER — FENTANYL CITRATE (PF) 100 MCG/2ML IJ SOLN
INTRAMUSCULAR | Status: DC | PRN
  Administered 2018-03-30 (×2): 50 ug via INTRAVENOUS

## 2018-03-30 MED ORDER — PROPOFOL 10 MG/ML IV BOLUS
INTRAVENOUS | Status: AC
  Filled 2018-03-30: qty 20

## 2018-03-30 MED ORDER — FENTANYL CITRATE (PF) 100 MCG/2ML IJ SOLN: 25.0000 ug | INTRAMUSCULAR | Status: DC | PRN

## 2018-03-30 MED ORDER — PHENYLEPHRINE HCL 10 MG/ML IJ SOLN
INTRAMUSCULAR | Status: DC | PRN
  Administered 2018-03-30: 80 ug via INTRAVENOUS

## 2018-03-30 MED ORDER — ONDANSETRON HCL 4 MG/2ML IJ SOLN
INTRAMUSCULAR | Status: DC | PRN
  Administered 2018-03-30: 4 mg via INTRAVENOUS

## 2018-03-30 MED ORDER — CEFAZOLIN SODIUM-DEXTROSE 2-4 GM/100ML-% IV SOLN
2.0000 g | INTRAVENOUS | Status: AC
  Administered 2018-03-30: 2 g via INTRAVENOUS
  Filled 2018-03-30: qty 100

## 2018-03-30 MED ORDER — ONDANSETRON HCL 4 MG/2ML IJ SOLN
INTRAMUSCULAR | Status: AC
  Filled 2018-03-30: qty 8

## 2018-03-30 MED ORDER — OXYCODONE-ACETAMINOPHEN 5-325 MG PO TABS: 1.0000 | ORAL_TABLET | Freq: Four times a day (QID) | ORAL | 0 refills | Status: DC | PRN

## 2018-03-30 MED ORDER — PHENYLEPHRINE 40 MCG/ML (10ML) SYRINGE FOR IV PUSH (FOR BLOOD PRESSURE SUPPORT)
PREFILLED_SYRINGE | INTRAVENOUS | Status: AC
  Filled 2018-03-30: qty 30

## 2018-03-30 MED ORDER — MIDAZOLAM HCL 2 MG/2ML IJ SOLN
INTRAMUSCULAR | Status: AC
  Filled 2018-03-30: qty 2

## 2018-03-30 MED ORDER — DEXAMETHASONE SODIUM PHOSPHATE 10 MG/ML IJ SOLN
INTRAMUSCULAR | Status: DC | PRN
  Administered 2018-03-30: 10 mg via INTRAVENOUS

## 2018-03-30 MED ORDER — LIDOCAINE 2% (20 MG/ML) 5 ML SYRINGE
INTRAMUSCULAR | Status: AC
  Filled 2018-03-30: qty 20

## 2018-03-30 MED ORDER — SODIUM CHLORIDE 0.9 % IV SOLN
INTRAVENOUS | Status: DC | PRN
  Administered 2018-03-30: 40 ug/min via INTRAVENOUS

## 2018-03-30 MED ORDER — LIDOCAINE-EPINEPHRINE (PF) 1 %-1:200000 IJ SOLN
INTRAMUSCULAR | Status: AC
  Filled 2018-03-30: qty 30

## 2018-03-30 MED ORDER — 0.9 % SODIUM CHLORIDE (POUR BTL) OPTIME
TOPICAL | Status: DC | PRN
  Administered 2018-03-30: 1000 mL

## 2018-03-30 MED ORDER — LIDOCAINE HCL (CARDIAC) PF 100 MG/5ML IV SOSY
PREFILLED_SYRINGE | INTRAVENOUS | Status: DC | PRN
  Administered 2018-03-30: 100 mg via INTRAVENOUS

## 2018-03-30 MED ORDER — SODIUM CHLORIDE 0.9 % IV SOLN
INTRAVENOUS | Status: AC
  Filled 2018-03-30: qty 1.2

## 2018-03-30 MED ORDER — SODIUM POLYSTYRENE SULFONATE 15 GM/60ML PO SUSP
30.0000 g | Freq: Once | ORAL | Status: AC
  Administered 2018-03-30: 30 g via ORAL
  Filled 2018-03-30: qty 120

## 2018-03-30 MED ORDER — FENTANYL CITRATE (PF) 250 MCG/5ML IJ SOLN
INTRAMUSCULAR | Status: AC
  Filled 2018-03-30: qty 5

## 2018-03-30 MED ORDER — PROPOFOL 10 MG/ML IV BOLUS
INTRAVENOUS | Status: DC | PRN
  Administered 2018-03-30 (×2): 100 mg via INTRAVENOUS

## 2018-03-30 MED ORDER — SODIUM CHLORIDE 0.9 % IV SOLN
INTRAVENOUS | Status: DC
  Administered 2018-03-30 (×2): via INTRAVENOUS

## 2018-03-30 MED ORDER — SUCCINYLCHOLINE CHLORIDE 20 MG/ML IJ SOLN
INTRAMUSCULAR | Status: DC | PRN
  Administered 2018-03-30: 160 mg via INTRAVENOUS

## 2018-03-30 MED ORDER — SODIUM CHLORIDE 0.9 % IV SOLN
INTRAVENOUS | Status: DC | PRN
  Administered 2018-03-30: 500 mL

## 2018-03-30 MED ORDER — MIDAZOLAM HCL 5 MG/5ML IJ SOLN
INTRAMUSCULAR | Status: DC | PRN
  Administered 2018-03-30: 2 mg via INTRAVENOUS

## 2018-03-30 SURGICAL SUPPLY — 35 items
ARMBAND PINK RESTRICT EXTREMIT (MISCELLANEOUS) ×2 IMPLANT
CANISTER SUCT 3000ML PPV (MISCELLANEOUS) ×2 IMPLANT
CLIP VESOCCLUDE MED 24/CT (CLIP) IMPLANT
CLIP VESOCCLUDE MED 6/CT (CLIP) IMPLANT
CLIP VESOCCLUDE SM WIDE 24/CT (CLIP) IMPLANT
CLIP VESOCCLUDE SM WIDE 6/CT (CLIP) IMPLANT
COVER PROBE W GEL 5X96 (DRAPES) ×2 IMPLANT
DERMABOND ADVANCED (GAUZE/BANDAGES/DRESSINGS) ×1
DERMABOND ADVANCED .7 DNX12 (GAUZE/BANDAGES/DRESSINGS) ×1 IMPLANT
ELECT REM PT RETURN 9FT ADLT (ELECTROSURGICAL) ×2
ELECTRODE REM PT RTRN 9FT ADLT (ELECTROSURGICAL) ×1 IMPLANT
GLOVE BIO SURGEON STRL SZ 6.5 (GLOVE) ×2 IMPLANT
GLOVE BIO SURGEON STRL SZ7.5 (GLOVE) ×2 IMPLANT
GLOVE BIOGEL PI IND STRL 7.0 (GLOVE) ×3 IMPLANT
GLOVE BIOGEL PI IND STRL 7.5 (GLOVE) ×1 IMPLANT
GLOVE BIOGEL PI INDICATOR 7.0 (GLOVE) ×3
GLOVE BIOGEL PI INDICATOR 7.5 (GLOVE) ×1
GLOVE ECLIPSE 7.0 STRL STRAW (GLOVE) ×2 IMPLANT
GOWN STRL REUS W/ TWL LRG LVL3 (GOWN DISPOSABLE) ×2 IMPLANT
GOWN STRL REUS W/ TWL XL LVL3 (GOWN DISPOSABLE) ×1 IMPLANT
GOWN STRL REUS W/TWL LRG LVL3 (GOWN DISPOSABLE) ×2
GOWN STRL REUS W/TWL XL LVL3 (GOWN DISPOSABLE) ×1
KIT BASIN OR (CUSTOM PROCEDURE TRAY) ×2 IMPLANT
KIT TURNOVER KIT B (KITS) ×2 IMPLANT
NS IRRIG 1000ML POUR BTL (IV SOLUTION) ×2 IMPLANT
PACK CV ACCESS (CUSTOM PROCEDURE TRAY) ×2 IMPLANT
PAD ARMBOARD 7.5X6 YLW CONV (MISCELLANEOUS) ×4 IMPLANT
SUT MNCRL AB 4-0 PS2 18 (SUTURE) ×4 IMPLANT
SUT PROLENE 6 0 BV (SUTURE) ×4 IMPLANT
SUT SILK 2 0 SH (SUTURE) ×2 IMPLANT
SUT VIC AB 3-0 SH 27 (SUTURE) ×2
SUT VIC AB 3-0 SH 27X BRD (SUTURE) ×2 IMPLANT
TOWEL GREEN STERILE (TOWEL DISPOSABLE) ×2 IMPLANT
UNDERPAD 30X30 (UNDERPADS AND DIAPERS) ×2 IMPLANT
WATER STERILE IRR 1000ML POUR (IV SOLUTION) ×2 IMPLANT

## 2018-03-30 NOTE — Op Note (Signed)
    Patient name: William Pittman MRN: [id number] DOB: 04-30-1966 Sex: male  03/30/2018 Pre-operative Diagnosis: ckd Post-operative diagnosis:  Same Surgeon:  Erlene Quan C. Donzetta Matters, MD Assistant: Laurence Slate, PA Procedure Performed: Left basilic vein av fistula  Indications: 52 year old male with chronic kidney disease left first stage basilic vein transposed fistula which has now matured well and is ready for the second stage.  Findings: Fistula was nearly 1 cm throughout its course.  At completion there is a palpable thrill in the runoff vein as well as a palpable left radial pulse all this was confirmed with Doppler.   Procedure:  The patient was identified in the holding area and taken to the operating room where is placed supine on the operative table general anesthesia was induced.  He was sterilely prepped and draped in the left upper extremity usual fashion given antibiotics and a timeout was called.  We began with a longitudinal incision over the palpable fistula just above the antecubitum.  We dissected down onto the vein.  We protected the nerve throughout its course.  We made a counterincision near the axilla.  Branches were taken between clips and ties.  The vein was marked for orientation.  He was then divided above the antecubitum and tunneled laterally.  Was flushed with heparinized saline noted to flush well.  We also flushed the inflow.  We spatulated both ends and sewed them and then with 6-0 Prolene suture.  Prior to completion we allowed flushing in the usual fashion.  Upon completion we had a palpable thrill in the vein palpable radial pulse at the wrist both confirmed with Doppler.  Doppler signal of the radial artery at the wrist also augmented with compression of the fistula.  Satisfied with this we irrigated the wounds closed in layers with Vicryl and Monocryl.  Dermabond was placed to level the skin.  Patient was allowed away from anesthesia having tolerated procedure without  immediate complication.  All counts were correct at completion.    EBL 50 cc.   Lachlyn Vanderstelt C. Donzetta Matters, MD [facility] [place] Office: [phone] Pager: [phone]

## 2018-03-30 NOTE — H&P (Signed)
   History and Physical Update  The patient was interviewed and re-examined.  The patient's previous History and Physical has been reviewed and is unchanged from recent office visit. Plan for 2nd stage bvt in OR.   Christopher Glasscock C. Donzetta Matters, MD [facility] Office: [phone] Pager: [phone]   03/30/2018, 8:28 AM

## 2018-03-30 NOTE — Anesthesia Postprocedure Evaluation (Signed)
Anesthesia Post Note  Patient: William Pittman  Procedure(s) Performed: BASILIC VEIN TRANSPOSITION SECOND STAGE (Left Arm Upper)     Patient location during evaluation: PACU Anesthesia Type: General Level of consciousness: awake Pain management: pain level controlled Vital Signs Assessment: post-procedure vital signs reviewed and stable Respiratory status: spontaneous breathing Cardiovascular status: stable Anesthetic complications: no    Last Vitals:  Vitals:   03/30/18 1301 03/30/18 1315  BP:  (!) 153/72  Pulse: 94 95  Resp: 12 15  Temp:    SpO2: 97% 97%    Last Pain:  Vitals:   03/30/18 1315  TempSrc:   PainSc: 0-No pain                 Juandedios Dudash

## 2018-03-30 NOTE — Anesthesia Procedure Notes (Signed)
Procedure Name: Intubation Date/Time: 03/30/2018 10:26 AM Performed by: Kayal Mula T, CRNA Pre-anesthesia Checklist: Patient identified, Emergency Drugs available, Suction available and Patient being monitored Patient Re-evaluated:Patient Re-evaluated prior to induction Oxygen Delivery Method: Circle system utilized Preoxygenation: Pre-oxygenation with 100% oxygen Induction Type: Combination inhalational/ intravenous induction Ventilation: Mask ventilation without difficulty and Oral airway inserted - appropriate to patient size Laryngoscope Size: Glidescope and 4 Grade View: Grade I Tube type: Oral Tube size: 7.5 mm Number of attempts: 2 Airway Equipment and Method: Patient positioned with wedge pillow,  Stylet and Video-laryngoscopy Placement Confirmation: ETT inserted through vocal cords under direct vision,  positive ETCO2 and breath sounds checked- equal and bilateral Secured at: 22 cm Tube secured with: Tape Dental Injury: Teeth and Oropharynx as per pre-operative assessment  Difficulty Due To: Difficult Airway- due to anterior larynx, Difficulty was anticipated, Difficult Airway- due to large tongue and Difficult Airway- due to limited oral opening Future Recommendations: Recommend- induction with short-acting agent, and alternative techniques readily available

## 2018-03-30 NOTE — Discharge Instructions (Signed)
° °  [facility]  Discharge Instructions  AV Fistula or Graft Surgery for Dialysis Access  Please refer to the following instructions for your post-procedure care. Your surgeon or physician assistant will discuss any changes with you.  Activity  You may drive the day following your surgery, if you are comfortable and no longer taking prescription pain medication. Resume full activity as the soreness in your incision resolves.  Bathing/Showering  You may shower after you go home. Keep your incision dry for 48 hours. Do not soak in a bathtub, hot tub, or swim until the incision heals completely. You may not shower if you have a hemodialysis catheter.  Incision Care  Clean your incision with mild soap and water after 48 hours. Pat the area dry with a clean towel. You do not need a bandage unless otherwise instructed. Do not apply any ointments or creams to your incision. You may have skin glue on your incision. Do not peel it off. It will come off on its own in about one week. Your arm may swell a bit after surgery. To reduce swelling use pillows to elevate your arm so it is above your heart. Your doctor will tell you if you need to lightly wrap your arm with an ACE bandage.  Diet  Resume your normal diet. There are not special food restrictions following this procedure. In order to heal from your surgery, it is CRITICAL to get adequate nutrition. Your body requires vitamins, minerals, and protein. Vegetables are the best source of vitamins and minerals. Vegetables also provide the perfect balance of protein. Processed food has little nutritional value, so try to avoid this.  Medications  Resume taking all of your medications. If your incision is causing pain, you may take over-the counter pain relievers such as acetaminophen (Tylenol). If you were prescribed a stronger pain medication, please be aware these medications can cause nausea and constipation. Prevent  nausea by taking the medication with a snack or meal. Avoid constipation by drinking plenty of fluids and eating foods with high amount of fiber, such as fruits, vegetables, and grains. Do not take Tylenol if you are taking prescription pain medications.     Follow up Your surgeon may want to see you in the office following your access surgery. If so, this will be arranged at the time of your surgery.  Please call us immediately for any of the following conditions:  Increased pain, redness, drainage (pus) from your incision site Fever of 101 degrees or higher Severe or worsening pain at your incision site Hand pain or numbness.  Reduce your risk of vascular disease:  Stop smoking. If you would like help, call [facility] at 1-800-QUIT-NOW 6623005904) or [place] at [place] your cholesterol Maintain a desired weight Control your diabetes Keep your blood pressure down  Dialysis  It will take several weeks to several months for your new dialysis access to be ready for use. Your surgeon will determine when it is OK to use it. Your nephrologist will continue to direct your dialysis. You can continue to use your Permcath until your new access is ready for use.  If you have any questions, please call the office at [phone].                Take KAYEXALATE 30g by mouth when home.

## 2018-03-30 NOTE — Anesthesia Preprocedure Evaluation (Addendum)
Anesthesia Evaluation  Patient identified by MRN, date of birth, ID band Patient awake    Reviewed: Allergy & Precautions, NPO status , Patient's Chart, lab work & pertinent test results  Airway Mallampati: II  TM Distance: >3 FB     Dental   Pulmonary neg pulmonary ROS,    breath sounds clear to auscultation       Cardiovascular hypertension,  Rhythm:Regular Rate:Normal     Neuro/Psych    GI/Hepatic negative GI ROS, Neg liver ROS,   Endo/Other  diabetes  Renal/GU Renal disease     Musculoskeletal   Abdominal   Peds  Hematology  (+) anemia ,   Anesthesia Other Findings   Reproductive/Obstetrics                            Anesthesia Physical Anesthesia Plan  ASA: III  Anesthesia Plan: General   Post-op Pain Management:    Induction: Intravenous  PONV Risk Score and Plan: Ondansetron, Dexamethasone and Midazolam  Airway Management Planned: LMA  Additional Equipment:   Intra-op Plan:   Post-operative Plan: Extubation in OR  Informed Consent: I have reviewed the patients History and Physical, chart, labs and discussed the procedure including the risks, benefits and alternatives for the proposed anesthesia with the patient or authorized representative who has indicated his/her understanding and acceptance.   Dental advisory given  Plan Discussed with: CRNA and Anesthesiologist  Anesthesia Plan Comments:         Anesthesia Quick Evaluation

## 2018-03-30 NOTE — Transfer of Care (Signed)
Immediate Anesthesia Transfer of Care Note  Patient: William Pittman  Procedure(s) Performed: BASILIC VEIN TRANSPOSITION SECOND STAGE (Left Arm Upper)  Patient Location: PACU  Anesthesia Type:General  Level of Consciousness: awake, alert  and oriented  Airway & Oxygen Therapy: Patient Spontanous Breathing  Post-op Assessment: Report given to RN, Post -op Vital signs reviewed and stable and Patient moving all extremities X 4  Post vital signs: Reviewed and stable  Last Vitals:  Vitals Value Taken Time  BP    Temp    Pulse    Resp    SpO2      Last Pain:  Vitals:   03/30/18 0800  TempSrc:   PainSc: 0-No pain      Patients Stated Pain Goal: 3 (39/53/20 2334)  Complications: No apparent anesthesia complications

## 2018-03-31 ENCOUNTER — Telehealth: Payer: Self-pay | Admitting: Vascular Surgery

## 2018-03-31 ENCOUNTER — Encounter (HOSPITAL_COMMUNITY): Payer: Self-pay | Admitting: Vascular Surgery

## 2018-03-31 NOTE — Telephone Encounter (Signed)
sch appt spk to pt 04/15/18 2pm p/o PA

## 2018-04-06 ENCOUNTER — Encounter (INDEPENDENT_AMBULATORY_CARE_PROVIDER_SITE_OTHER): Payer: BLUE CROSS/BLUE SHIELD | Admitting: Vascular Surgery

## 2018-04-06 DIAGNOSIS — Z0279 Encounter for issue of other medical certificate: Secondary | ICD-10-CM

## 2018-04-15 ENCOUNTER — Ambulatory Visit (INDEPENDENT_AMBULATORY_CARE_PROVIDER_SITE_OTHER): Payer: Self-pay | Admitting: Physician Assistant

## 2018-04-15 ENCOUNTER — Other Ambulatory Visit: Payer: Self-pay

## 2018-04-15 VITALS — BP 203/92 | HR 106 | Temp 98.4°F | Resp 16 | Ht 66.0 in | Wt 195.0 lb

## 2018-04-15 DIAGNOSIS — N184 Chronic kidney disease, stage 4 (severe): Secondary | ICD-10-CM

## 2018-04-15 NOTE — Progress Notes (Signed)
  POST OPERATIVE OFFICE NOTE    CC:  F/u for surgery  HPI:  This is a 52 y.o. male who is s/p left brachiobasilic AV fistula creation on 02/08/18 by Dr. Donzetta Matters.  He underwent 2nd stage left BVT on 03/30/18 by Dr. Donzetta Matters.  He is not currently on dialysis.  He states his hand does not have any pain or numbness present.  He states that he does have some numbness on his forearm.  His blood pressure is elevated today-he denies any headaches, dizziness or visual changes.  He states that he does not have a refill on his torsemide.  He has an appointment on Monday at [facility] for transplant process.  He has an appointment with Dr. Justin Mend on 04/25/18.    No Known Allergies  Current Outpatient Medications  Medication Sig Dispense Refill  . amLODipine (NORVASC) 10 MG tablet Take 10 mg by mouth daily.    Marland Kitchen aspirin 81 MG tablet Take 81 mg by mouth daily.    Marland Kitchen atorvastatin (LIPITOR) 80 MG tablet Take 80 mg by mouth daily.    . calcitRIOL (ROCALTROL) 0.25 MCG capsule Take 0.25 mcg by mouth daily.    . Dulaglutide (TRULICITY) 1.5 QX/4.5WT SOPN Inject 1.5 mg into the skin once a week. Saturday    . hydrALAZINE (APRESOLINE) 50 MG tablet Take 50 mg by mouth 2 (two) times daily.    . insulin glargine (LANTUS) 100 UNIT/ML injection Inject 40 Units into the skin at bedtime.     Marland Kitchen oxyCODONE-acetaminophen (PERCOCET) 5-325 MG tablet Take 1 tablet by mouth every 6 (six) hours as needed for severe pain. 12 tablet 0  . torsemide (DEMADEX) 20 MG tablet Take 40 mg by mouth every morning.      No current facility-administered medications for this visit.      ROS:  See HPI  Physical Exam:  Vitals:   04/15/18 1347 04/15/18 1350  BP: (!) 204/91 (!) 203/92  Pulse: (!) 106 (!) 106  Resp: 16   Temp: 98.4 F (36.9 C)   Blood pressure taken again after visit and is 203/92 again.   Vitals:   04/15/18 1347  Weight: 195 lb (88.5 kg)  Height: 5\' 6"  (1.676 m)   Body mass index is 31.47 kg/m.   Incision:  Well  healed Extremities:  +palpable left radial pulse; excellent thrill within the fistula and easily palpable.  Motor and sensory are in tact left hand.     Assessment/Plan:  This is a 52 y.o. male who is s/p: left brachiobasilic AV fistula creation on 02/08/18 by Dr. Donzetta Matters.  She underwent 2nd stage left BVT on 03/30/18 by Dr. Donzetta Matters.    -pt's left arm fistula is easily palpable with excellent thrill and will be ready for use 05/11/18. -he is hypertensive today but not much different from his last visit with Dr. Donzetta Matters.  He states he does not have a refill on his torsemide.  He is asymptomatic.  I instructed him to call his PCP, Dr. Buddy Duty this afternoon to inform them of his blood pressure as they may want to add or change his BP regimen.  He expresses understanding and will call.   -he will f/u with [facility] as needed.    Leontine Locket, PA-C [facility] [phone]  Clinic MD:  Donzetta Matters

## 2018-04-18 DIAGNOSIS — Z01818 Encounter for other preprocedural examination: Secondary | ICD-10-CM | POA: Diagnosis not present

## 2018-04-18 DIAGNOSIS — N185 Chronic kidney disease, stage 5: Secondary | ICD-10-CM | POA: Diagnosis not present

## 2018-04-18 DIAGNOSIS — E1122 Type 2 diabetes mellitus with diabetic chronic kidney disease: Secondary | ICD-10-CM | POA: Diagnosis not present

## 2018-04-18 DIAGNOSIS — I120 Hypertensive chronic kidney disease with stage 5 chronic kidney disease or end stage renal disease: Secondary | ICD-10-CM | POA: Diagnosis not present

## 2018-04-26 DIAGNOSIS — D631 Anemia in chronic kidney disease: Secondary | ICD-10-CM | POA: Diagnosis not present

## 2018-04-26 DIAGNOSIS — N185 Chronic kidney disease, stage 5: Secondary | ICD-10-CM | POA: Diagnosis not present

## 2018-04-26 DIAGNOSIS — N2581 Secondary hyperparathyroidism of renal origin: Secondary | ICD-10-CM | POA: Diagnosis not present

## 2018-04-26 DIAGNOSIS — I120 Hypertensive chronic kidney disease with stage 5 chronic kidney disease or end stage renal disease: Secondary | ICD-10-CM | POA: Diagnosis not present

## 2018-05-13 ENCOUNTER — Ambulatory Visit
Admission: RE | Admit: 2018-05-13 | Discharge: 2018-05-13 | Disposition: A | Discharge status: Home / Self Care | Payer: BLUE CROSS/BLUE SHIELD | Source: Ambulatory Visit | Attending: Internal Medicine | Admitting: Internal Medicine

## 2018-05-13 ENCOUNTER — Other Ambulatory Visit: Payer: Self-pay | Admitting: Internal Medicine

## 2018-05-13 DIAGNOSIS — Z794 Long term (current) use of insulin: Secondary | ICD-10-CM | POA: Diagnosis not present

## 2018-05-13 DIAGNOSIS — R05 Cough: Secondary | ICD-10-CM | POA: Diagnosis not present

## 2018-05-13 DIAGNOSIS — E1122 Type 2 diabetes mellitus with diabetic chronic kidney disease: Secondary | ICD-10-CM | POA: Diagnosis not present

## 2018-05-13 DIAGNOSIS — R062 Wheezing: Secondary | ICD-10-CM

## 2018-05-13 DIAGNOSIS — R509 Fever, unspecified: Secondary | ICD-10-CM | POA: Diagnosis not present

## 2018-05-13 DIAGNOSIS — N185 Chronic kidney disease, stage 5: Secondary | ICD-10-CM | POA: Diagnosis not present

## 2018-05-18 DIAGNOSIS — I313 Pericardial effusion (noninflammatory): Secondary | ICD-10-CM | POA: Diagnosis not present

## 2018-05-18 DIAGNOSIS — Z79899 Other long term (current) drug therapy: Secondary | ICD-10-CM | POA: Diagnosis not present

## 2018-05-18 DIAGNOSIS — I358 Other nonrheumatic aortic valve disorders: Secondary | ICD-10-CM | POA: Diagnosis not present

## 2018-05-18 DIAGNOSIS — N185 Chronic kidney disease, stage 5: Secondary | ICD-10-CM | POA: Diagnosis not present

## 2018-05-18 DIAGNOSIS — E785 Hyperlipidemia, unspecified: Secondary | ICD-10-CM | POA: Diagnosis not present

## 2018-05-18 DIAGNOSIS — I120 Hypertensive chronic kidney disease with stage 5 chronic kidney disease or end stage renal disease: Secondary | ICD-10-CM | POA: Diagnosis not present

## 2018-05-18 DIAGNOSIS — Z794 Long term (current) use of insulin: Secondary | ICD-10-CM | POA: Diagnosis not present

## 2018-05-18 DIAGNOSIS — Z01818 Encounter for other preprocedural examination: Secondary | ICD-10-CM | POA: Diagnosis not present

## 2018-05-18 DIAGNOSIS — I517 Cardiomegaly: Secondary | ICD-10-CM | POA: Diagnosis not present

## 2018-05-18 DIAGNOSIS — I6523 Occlusion and stenosis of bilateral carotid arteries: Secondary | ICD-10-CM | POA: Diagnosis not present

## 2018-05-18 DIAGNOSIS — Z7982 Long term (current) use of aspirin: Secondary | ICD-10-CM | POA: Diagnosis not present

## 2018-05-18 DIAGNOSIS — E119 Type 2 diabetes mellitus without complications: Secondary | ICD-10-CM | POA: Diagnosis not present

## 2018-05-18 DIAGNOSIS — R000 Tachycardia, unspecified: Secondary | ICD-10-CM | POA: Diagnosis not present

## 2018-05-18 DIAGNOSIS — E1122 Type 2 diabetes mellitus with diabetic chronic kidney disease: Secondary | ICD-10-CM | POA: Diagnosis not present

## 2018-06-10 DIAGNOSIS — N2581 Secondary hyperparathyroidism of renal origin: Secondary | ICD-10-CM | POA: Diagnosis not present

## 2018-06-10 DIAGNOSIS — N189 Chronic kidney disease, unspecified: Secondary | ICD-10-CM | POA: Diagnosis not present

## 2018-06-10 DIAGNOSIS — I120 Hypertensive chronic kidney disease with stage 5 chronic kidney disease or end stage renal disease: Secondary | ICD-10-CM | POA: Diagnosis not present

## 2018-06-10 DIAGNOSIS — N185 Chronic kidney disease, stage 5: Secondary | ICD-10-CM | POA: Diagnosis not present

## 2018-06-10 DIAGNOSIS — D631 Anemia in chronic kidney disease: Secondary | ICD-10-CM | POA: Diagnosis not present

## 2018-06-22 DIAGNOSIS — N186 End stage renal disease: Secondary | ICD-10-CM | POA: Diagnosis not present

## 2018-06-22 DIAGNOSIS — N2581 Secondary hyperparathyroidism of renal origin: Secondary | ICD-10-CM | POA: Diagnosis not present

## 2018-06-25 DIAGNOSIS — E1022 Type 1 diabetes mellitus with diabetic chronic kidney disease: Secondary | ICD-10-CM | POA: Diagnosis not present

## 2018-06-25 DIAGNOSIS — Z992 Dependence on renal dialysis: Secondary | ICD-10-CM | POA: Diagnosis not present

## 2018-06-25 DIAGNOSIS — N186 End stage renal disease: Secondary | ICD-10-CM | POA: Diagnosis not present

## 2018-06-25 DIAGNOSIS — N2581 Secondary hyperparathyroidism of renal origin: Secondary | ICD-10-CM | POA: Diagnosis not present

## 2018-06-28 DIAGNOSIS — N186 End stage renal disease: Secondary | ICD-10-CM | POA: Diagnosis not present

## 2018-06-28 DIAGNOSIS — N2581 Secondary hyperparathyroidism of renal origin: Secondary | ICD-10-CM | POA: Diagnosis not present

## 2018-06-30 DIAGNOSIS — N186 End stage renal disease: Secondary | ICD-10-CM | POA: Diagnosis not present

## 2018-06-30 DIAGNOSIS — N2581 Secondary hyperparathyroidism of renal origin: Secondary | ICD-10-CM | POA: Diagnosis not present

## 2018-07-02 DIAGNOSIS — N186 End stage renal disease: Secondary | ICD-10-CM | POA: Diagnosis not present

## 2018-07-02 DIAGNOSIS — N2581 Secondary hyperparathyroidism of renal origin: Secondary | ICD-10-CM | POA: Diagnosis not present

## 2018-07-05 DIAGNOSIS — N2581 Secondary hyperparathyroidism of renal origin: Secondary | ICD-10-CM | POA: Diagnosis not present

## 2018-07-05 DIAGNOSIS — N186 End stage renal disease: Secondary | ICD-10-CM | POA: Diagnosis not present

## 2018-07-05 DIAGNOSIS — Z23 Encounter for immunization: Secondary | ICD-10-CM | POA: Diagnosis not present

## 2018-07-07 DIAGNOSIS — N186 End stage renal disease: Secondary | ICD-10-CM | POA: Diagnosis not present

## 2018-07-07 DIAGNOSIS — Z23 Encounter for immunization: Secondary | ICD-10-CM | POA: Diagnosis not present

## 2018-07-07 DIAGNOSIS — N2581 Secondary hyperparathyroidism of renal origin: Secondary | ICD-10-CM | POA: Diagnosis not present

## 2018-07-09 DIAGNOSIS — Z23 Encounter for immunization: Secondary | ICD-10-CM | POA: Diagnosis not present

## 2018-07-09 DIAGNOSIS — N186 End stage renal disease: Secondary | ICD-10-CM | POA: Diagnosis not present

## 2018-07-09 DIAGNOSIS — N2581 Secondary hyperparathyroidism of renal origin: Secondary | ICD-10-CM | POA: Diagnosis not present

## 2018-07-12 DIAGNOSIS — N186 End stage renal disease: Secondary | ICD-10-CM | POA: Diagnosis not present

## 2018-07-12 DIAGNOSIS — N2581 Secondary hyperparathyroidism of renal origin: Secondary | ICD-10-CM | POA: Diagnosis not present

## 2018-07-14 DIAGNOSIS — N186 End stage renal disease: Secondary | ICD-10-CM | POA: Diagnosis not present

## 2018-07-14 DIAGNOSIS — N2581 Secondary hyperparathyroidism of renal origin: Secondary | ICD-10-CM | POA: Diagnosis not present

## 2018-07-16 DIAGNOSIS — N2581 Secondary hyperparathyroidism of renal origin: Secondary | ICD-10-CM | POA: Diagnosis not present

## 2018-07-16 DIAGNOSIS — N186 End stage renal disease: Secondary | ICD-10-CM | POA: Diagnosis not present

## 2018-07-21 DIAGNOSIS — N2581 Secondary hyperparathyroidism of renal origin: Secondary | ICD-10-CM | POA: Diagnosis not present

## 2018-07-21 DIAGNOSIS — N186 End stage renal disease: Secondary | ICD-10-CM | POA: Diagnosis not present

## 2018-07-23 DIAGNOSIS — N186 End stage renal disease: Secondary | ICD-10-CM | POA: Diagnosis not present

## 2018-07-23 DIAGNOSIS — N2581 Secondary hyperparathyroidism of renal origin: Secondary | ICD-10-CM | POA: Diagnosis not present

## 2018-07-26 DIAGNOSIS — Z992 Dependence on renal dialysis: Secondary | ICD-10-CM | POA: Diagnosis not present

## 2018-07-26 DIAGNOSIS — N186 End stage renal disease: Secondary | ICD-10-CM | POA: Diagnosis not present

## 2018-07-26 DIAGNOSIS — N2581 Secondary hyperparathyroidism of renal origin: Secondary | ICD-10-CM | POA: Diagnosis not present

## 2018-07-26 DIAGNOSIS — E1022 Type 1 diabetes mellitus with diabetic chronic kidney disease: Secondary | ICD-10-CM | POA: Diagnosis not present

## 2018-07-28 DIAGNOSIS — N2581 Secondary hyperparathyroidism of renal origin: Secondary | ICD-10-CM | POA: Diagnosis not present

## 2018-07-28 DIAGNOSIS — N186 End stage renal disease: Secondary | ICD-10-CM | POA: Diagnosis not present

## 2018-07-30 DIAGNOSIS — N2581 Secondary hyperparathyroidism of renal origin: Secondary | ICD-10-CM | POA: Diagnosis not present

## 2018-07-30 DIAGNOSIS — N186 End stage renal disease: Secondary | ICD-10-CM | POA: Diagnosis not present

## 2018-08-02 DIAGNOSIS — N2581 Secondary hyperparathyroidism of renal origin: Secondary | ICD-10-CM | POA: Diagnosis not present

## 2018-08-02 DIAGNOSIS — N186 End stage renal disease: Secondary | ICD-10-CM | POA: Diagnosis not present

## 2018-08-02 DIAGNOSIS — Z23 Encounter for immunization: Secondary | ICD-10-CM | POA: Diagnosis not present

## 2018-08-04 DIAGNOSIS — Z23 Encounter for immunization: Secondary | ICD-10-CM | POA: Diagnosis not present

## 2018-08-04 DIAGNOSIS — N186 End stage renal disease: Secondary | ICD-10-CM | POA: Diagnosis not present

## 2018-08-04 DIAGNOSIS — N2581 Secondary hyperparathyroidism of renal origin: Secondary | ICD-10-CM | POA: Diagnosis not present

## 2018-08-06 DIAGNOSIS — Z23 Encounter for immunization: Secondary | ICD-10-CM | POA: Diagnosis not present

## 2018-08-06 DIAGNOSIS — N186 End stage renal disease: Secondary | ICD-10-CM | POA: Diagnosis not present

## 2018-08-06 DIAGNOSIS — N2581 Secondary hyperparathyroidism of renal origin: Secondary | ICD-10-CM | POA: Diagnosis not present

## 2018-08-09 DIAGNOSIS — N186 End stage renal disease: Secondary | ICD-10-CM | POA: Diagnosis not present

## 2018-08-09 DIAGNOSIS — N2581 Secondary hyperparathyroidism of renal origin: Secondary | ICD-10-CM | POA: Diagnosis not present

## 2018-08-11 DIAGNOSIS — N2581 Secondary hyperparathyroidism of renal origin: Secondary | ICD-10-CM | POA: Diagnosis not present

## 2018-08-11 DIAGNOSIS — N186 End stage renal disease: Secondary | ICD-10-CM | POA: Diagnosis not present

## 2018-08-13 DIAGNOSIS — N2581 Secondary hyperparathyroidism of renal origin: Secondary | ICD-10-CM | POA: Diagnosis not present

## 2018-08-13 DIAGNOSIS — N186 End stage renal disease: Secondary | ICD-10-CM | POA: Diagnosis not present

## 2018-08-16 DIAGNOSIS — E1122 Type 2 diabetes mellitus with diabetic chronic kidney disease: Secondary | ICD-10-CM | POA: Diagnosis not present

## 2018-08-16 DIAGNOSIS — Z794 Long term (current) use of insulin: Secondary | ICD-10-CM | POA: Diagnosis not present

## 2018-08-16 DIAGNOSIS — N2581 Secondary hyperparathyroidism of renal origin: Secondary | ICD-10-CM | POA: Diagnosis not present

## 2018-08-16 DIAGNOSIS — N185 Chronic kidney disease, stage 5: Secondary | ICD-10-CM | POA: Diagnosis not present

## 2018-08-16 DIAGNOSIS — N186 End stage renal disease: Secondary | ICD-10-CM | POA: Diagnosis not present

## 2018-08-18 DIAGNOSIS — N186 End stage renal disease: Secondary | ICD-10-CM | POA: Diagnosis not present

## 2018-08-18 DIAGNOSIS — N2581 Secondary hyperparathyroidism of renal origin: Secondary | ICD-10-CM | POA: Diagnosis not present

## 2018-08-20 DIAGNOSIS — N2581 Secondary hyperparathyroidism of renal origin: Secondary | ICD-10-CM | POA: Diagnosis not present

## 2018-08-20 DIAGNOSIS — N186 End stage renal disease: Secondary | ICD-10-CM | POA: Diagnosis not present

## 2018-08-22 ENCOUNTER — Telehealth: Payer: Self-pay

## 2018-08-22 NOTE — Telephone Encounter (Signed)
Spoke with Melissa at [facility] [phone]) to let her know pt will be called to have an office appt scheduled with the PA.

## 2018-08-23 DIAGNOSIS — N2581 Secondary hyperparathyroidism of renal origin: Secondary | ICD-10-CM | POA: Diagnosis not present

## 2018-08-23 DIAGNOSIS — N186 End stage renal disease: Secondary | ICD-10-CM | POA: Diagnosis not present

## 2018-08-23 DIAGNOSIS — Z23 Encounter for immunization: Secondary | ICD-10-CM | POA: Diagnosis not present

## 2018-08-25 DIAGNOSIS — N2581 Secondary hyperparathyroidism of renal origin: Secondary | ICD-10-CM | POA: Diagnosis not present

## 2018-08-25 DIAGNOSIS — Z23 Encounter for immunization: Secondary | ICD-10-CM | POA: Diagnosis not present

## 2018-08-25 DIAGNOSIS — N186 End stage renal disease: Secondary | ICD-10-CM | POA: Diagnosis not present

## 2018-08-26 DIAGNOSIS — N186 End stage renal disease: Secondary | ICD-10-CM | POA: Diagnosis not present

## 2018-08-26 DIAGNOSIS — E1022 Type 1 diabetes mellitus with diabetic chronic kidney disease: Secondary | ICD-10-CM | POA: Diagnosis not present

## 2018-08-26 DIAGNOSIS — Z992 Dependence on renal dialysis: Secondary | ICD-10-CM | POA: Diagnosis not present

## 2018-08-27 DIAGNOSIS — N186 End stage renal disease: Secondary | ICD-10-CM | POA: Diagnosis not present

## 2018-08-27 DIAGNOSIS — N2581 Secondary hyperparathyroidism of renal origin: Secondary | ICD-10-CM | POA: Diagnosis not present

## 2018-08-30 DIAGNOSIS — N2581 Secondary hyperparathyroidism of renal origin: Secondary | ICD-10-CM | POA: Diagnosis not present

## 2018-08-30 DIAGNOSIS — N186 End stage renal disease: Secondary | ICD-10-CM | POA: Diagnosis not present

## 2018-08-31 ENCOUNTER — Ambulatory Visit: Payer: BLUE CROSS/BLUE SHIELD

## 2018-09-01 DIAGNOSIS — N186 End stage renal disease: Secondary | ICD-10-CM | POA: Diagnosis not present

## 2018-09-01 DIAGNOSIS — N2581 Secondary hyperparathyroidism of renal origin: Secondary | ICD-10-CM | POA: Diagnosis not present

## 2018-09-03 DIAGNOSIS — N2581 Secondary hyperparathyroidism of renal origin: Secondary | ICD-10-CM | POA: Diagnosis not present

## 2018-09-03 DIAGNOSIS — N186 End stage renal disease: Secondary | ICD-10-CM | POA: Diagnosis not present

## 2018-09-06 DIAGNOSIS — N186 End stage renal disease: Secondary | ICD-10-CM | POA: Diagnosis not present

## 2018-09-06 DIAGNOSIS — N2581 Secondary hyperparathyroidism of renal origin: Secondary | ICD-10-CM | POA: Diagnosis not present

## 2018-09-06 DIAGNOSIS — Z23 Encounter for immunization: Secondary | ICD-10-CM | POA: Diagnosis not present

## 2018-09-08 DIAGNOSIS — Z23 Encounter for immunization: Secondary | ICD-10-CM | POA: Diagnosis not present

## 2018-09-08 DIAGNOSIS — N186 End stage renal disease: Secondary | ICD-10-CM | POA: Diagnosis not present

## 2018-09-08 DIAGNOSIS — N2581 Secondary hyperparathyroidism of renal origin: Secondary | ICD-10-CM | POA: Diagnosis not present

## 2018-09-10 DIAGNOSIS — N2581 Secondary hyperparathyroidism of renal origin: Secondary | ICD-10-CM | POA: Diagnosis not present

## 2018-09-10 DIAGNOSIS — Z23 Encounter for immunization: Secondary | ICD-10-CM | POA: Diagnosis not present

## 2018-09-10 DIAGNOSIS — N186 End stage renal disease: Secondary | ICD-10-CM | POA: Diagnosis not present

## 2018-09-12 DIAGNOSIS — N186 End stage renal disease: Secondary | ICD-10-CM | POA: Diagnosis not present

## 2018-09-12 DIAGNOSIS — N2581 Secondary hyperparathyroidism of renal origin: Secondary | ICD-10-CM | POA: Diagnosis not present

## 2018-09-14 DIAGNOSIS — N186 End stage renal disease: Secondary | ICD-10-CM | POA: Diagnosis not present

## 2018-09-14 DIAGNOSIS — N2581 Secondary hyperparathyroidism of renal origin: Secondary | ICD-10-CM | POA: Diagnosis not present

## 2018-09-16 DIAGNOSIS — N2581 Secondary hyperparathyroidism of renal origin: Secondary | ICD-10-CM | POA: Diagnosis not present

## 2018-09-16 DIAGNOSIS — N186 End stage renal disease: Secondary | ICD-10-CM | POA: Diagnosis not present

## 2018-09-19 DIAGNOSIS — N2581 Secondary hyperparathyroidism of renal origin: Secondary | ICD-10-CM | POA: Diagnosis not present

## 2018-09-19 DIAGNOSIS — N186 End stage renal disease: Secondary | ICD-10-CM | POA: Diagnosis not present

## 2018-09-21 DIAGNOSIS — N186 End stage renal disease: Secondary | ICD-10-CM | POA: Diagnosis not present

## 2018-09-21 DIAGNOSIS — N2581 Secondary hyperparathyroidism of renal origin: Secondary | ICD-10-CM | POA: Diagnosis not present

## 2018-09-23 DIAGNOSIS — N186 End stage renal disease: Secondary | ICD-10-CM | POA: Diagnosis not present

## 2018-09-23 DIAGNOSIS — N2581 Secondary hyperparathyroidism of renal origin: Secondary | ICD-10-CM | POA: Diagnosis not present

## 2018-09-24 DIAGNOSIS — Z992 Dependence on renal dialysis: Secondary | ICD-10-CM | POA: Diagnosis not present

## 2018-09-24 DIAGNOSIS — E1022 Type 1 diabetes mellitus with diabetic chronic kidney disease: Secondary | ICD-10-CM | POA: Diagnosis not present

## 2018-09-24 DIAGNOSIS — N186 End stage renal disease: Secondary | ICD-10-CM | POA: Diagnosis not present

## 2018-09-26 DIAGNOSIS — N186 End stage renal disease: Secondary | ICD-10-CM | POA: Diagnosis not present

## 2018-09-26 DIAGNOSIS — N2581 Secondary hyperparathyroidism of renal origin: Secondary | ICD-10-CM | POA: Diagnosis not present

## 2018-09-28 ENCOUNTER — Ambulatory Visit
Admission: RE | Admit: 2018-09-28 | Discharge: 2018-09-28 | Disposition: A | Discharge status: Home / Self Care | Payer: BLUE CROSS/BLUE SHIELD | Source: Ambulatory Visit | Attending: Nephrology | Admitting: Nephrology

## 2018-09-28 ENCOUNTER — Other Ambulatory Visit: Payer: Self-pay | Admitting: Nephrology

## 2018-09-28 DIAGNOSIS — N2581 Secondary hyperparathyroidism of renal origin: Secondary | ICD-10-CM | POA: Diagnosis not present

## 2018-09-28 DIAGNOSIS — R7611 Nonspecific reaction to tuberculin skin test without active tuberculosis: Secondary | ICD-10-CM

## 2018-09-28 DIAGNOSIS — N186 End stage renal disease: Secondary | ICD-10-CM | POA: Diagnosis not present

## 2018-09-30 DIAGNOSIS — N186 End stage renal disease: Secondary | ICD-10-CM | POA: Diagnosis not present

## 2018-09-30 DIAGNOSIS — N2581 Secondary hyperparathyroidism of renal origin: Secondary | ICD-10-CM | POA: Diagnosis not present

## 2018-10-03 DIAGNOSIS — N2581 Secondary hyperparathyroidism of renal origin: Secondary | ICD-10-CM | POA: Diagnosis not present

## 2018-10-03 DIAGNOSIS — N186 End stage renal disease: Secondary | ICD-10-CM | POA: Diagnosis not present

## 2018-10-05 DIAGNOSIS — N2581 Secondary hyperparathyroidism of renal origin: Secondary | ICD-10-CM | POA: Diagnosis not present

## 2018-10-05 DIAGNOSIS — N186 End stage renal disease: Secondary | ICD-10-CM | POA: Diagnosis not present

## 2018-10-07 DIAGNOSIS — N186 End stage renal disease: Secondary | ICD-10-CM | POA: Diagnosis not present

## 2018-10-07 DIAGNOSIS — N2581 Secondary hyperparathyroidism of renal origin: Secondary | ICD-10-CM | POA: Diagnosis not present

## 2018-10-10 DIAGNOSIS — N2581 Secondary hyperparathyroidism of renal origin: Secondary | ICD-10-CM | POA: Diagnosis not present

## 2018-10-10 DIAGNOSIS — N186 End stage renal disease: Secondary | ICD-10-CM | POA: Diagnosis not present

## 2018-10-12 DIAGNOSIS — N186 End stage renal disease: Secondary | ICD-10-CM | POA: Diagnosis not present

## 2018-10-12 DIAGNOSIS — N2581 Secondary hyperparathyroidism of renal origin: Secondary | ICD-10-CM | POA: Diagnosis not present

## 2018-10-14 DIAGNOSIS — N2581 Secondary hyperparathyroidism of renal origin: Secondary | ICD-10-CM | POA: Diagnosis not present

## 2018-10-14 DIAGNOSIS — N186 End stage renal disease: Secondary | ICD-10-CM | POA: Diagnosis not present

## 2018-10-17 DIAGNOSIS — N186 End stage renal disease: Secondary | ICD-10-CM | POA: Diagnosis not present

## 2018-10-17 DIAGNOSIS — N2581 Secondary hyperparathyroidism of renal origin: Secondary | ICD-10-CM | POA: Diagnosis not present

## 2018-10-19 DIAGNOSIS — N2581 Secondary hyperparathyroidism of renal origin: Secondary | ICD-10-CM | POA: Diagnosis not present

## 2018-10-19 DIAGNOSIS — N186 End stage renal disease: Secondary | ICD-10-CM | POA: Diagnosis not present

## 2018-10-21 DIAGNOSIS — N186 End stage renal disease: Secondary | ICD-10-CM | POA: Diagnosis not present

## 2018-10-21 DIAGNOSIS — N2581 Secondary hyperparathyroidism of renal origin: Secondary | ICD-10-CM | POA: Diagnosis not present

## 2018-10-24 DIAGNOSIS — N2581 Secondary hyperparathyroidism of renal origin: Secondary | ICD-10-CM | POA: Diagnosis not present

## 2018-10-24 DIAGNOSIS — N186 End stage renal disease: Secondary | ICD-10-CM | POA: Diagnosis not present

## 2018-10-25 DIAGNOSIS — E1022 Type 1 diabetes mellitus with diabetic chronic kidney disease: Secondary | ICD-10-CM | POA: Diagnosis not present

## 2018-10-25 DIAGNOSIS — Z992 Dependence on renal dialysis: Secondary | ICD-10-CM | POA: Diagnosis not present

## 2018-10-25 DIAGNOSIS — N186 End stage renal disease: Secondary | ICD-10-CM | POA: Diagnosis not present

## 2018-10-26 DIAGNOSIS — Z23 Encounter for immunization: Secondary | ICD-10-CM | POA: Diagnosis not present

## 2018-10-26 DIAGNOSIS — N2581 Secondary hyperparathyroidism of renal origin: Secondary | ICD-10-CM | POA: Diagnosis not present

## 2018-10-26 DIAGNOSIS — N186 End stage renal disease: Secondary | ICD-10-CM | POA: Diagnosis not present

## 2018-10-28 DIAGNOSIS — Z23 Encounter for immunization: Secondary | ICD-10-CM | POA: Diagnosis not present

## 2018-10-28 DIAGNOSIS — N2581 Secondary hyperparathyroidism of renal origin: Secondary | ICD-10-CM | POA: Diagnosis not present

## 2018-10-28 DIAGNOSIS — N186 End stage renal disease: Secondary | ICD-10-CM | POA: Diagnosis not present

## 2018-10-31 DIAGNOSIS — N2581 Secondary hyperparathyroidism of renal origin: Secondary | ICD-10-CM | POA: Diagnosis not present

## 2018-10-31 DIAGNOSIS — N186 End stage renal disease: Secondary | ICD-10-CM | POA: Diagnosis not present

## 2018-11-02 DIAGNOSIS — N2581 Secondary hyperparathyroidism of renal origin: Secondary | ICD-10-CM | POA: Diagnosis not present

## 2018-11-02 DIAGNOSIS — N186 End stage renal disease: Secondary | ICD-10-CM | POA: Diagnosis not present

## 2018-11-04 DIAGNOSIS — N186 End stage renal disease: Secondary | ICD-10-CM | POA: Diagnosis not present

## 2018-11-04 DIAGNOSIS — N2581 Secondary hyperparathyroidism of renal origin: Secondary | ICD-10-CM | POA: Diagnosis not present

## 2018-11-07 DIAGNOSIS — N186 End stage renal disease: Secondary | ICD-10-CM | POA: Diagnosis not present

## 2018-11-07 DIAGNOSIS — N2581 Secondary hyperparathyroidism of renal origin: Secondary | ICD-10-CM | POA: Diagnosis not present

## 2018-11-09 DIAGNOSIS — N186 End stage renal disease: Secondary | ICD-10-CM | POA: Diagnosis not present

## 2018-11-09 DIAGNOSIS — N2581 Secondary hyperparathyroidism of renal origin: Secondary | ICD-10-CM | POA: Diagnosis not present

## 2018-11-10 DIAGNOSIS — E1122 Type 2 diabetes mellitus with diabetic chronic kidney disease: Secondary | ICD-10-CM | POA: Diagnosis not present

## 2018-11-11 DIAGNOSIS — N2581 Secondary hyperparathyroidism of renal origin: Secondary | ICD-10-CM | POA: Diagnosis not present

## 2018-11-11 DIAGNOSIS — N186 End stage renal disease: Secondary | ICD-10-CM | POA: Diagnosis not present

## 2018-11-14 DIAGNOSIS — N2581 Secondary hyperparathyroidism of renal origin: Secondary | ICD-10-CM | POA: Diagnosis not present

## 2018-11-14 DIAGNOSIS — N186 End stage renal disease: Secondary | ICD-10-CM | POA: Diagnosis not present

## 2018-11-15 DIAGNOSIS — N185 Chronic kidney disease, stage 5: Secondary | ICD-10-CM | POA: Diagnosis not present

## 2018-11-15 DIAGNOSIS — Z794 Long term (current) use of insulin: Secondary | ICD-10-CM | POA: Diagnosis not present

## 2018-11-15 DIAGNOSIS — E1122 Type 2 diabetes mellitus with diabetic chronic kidney disease: Secondary | ICD-10-CM | POA: Diagnosis not present

## 2018-11-16 DIAGNOSIS — N2581 Secondary hyperparathyroidism of renal origin: Secondary | ICD-10-CM | POA: Diagnosis not present

## 2018-11-16 DIAGNOSIS — N186 End stage renal disease: Secondary | ICD-10-CM | POA: Diagnosis not present

## 2018-11-18 DIAGNOSIS — N186 End stage renal disease: Secondary | ICD-10-CM | POA: Diagnosis not present

## 2018-11-18 DIAGNOSIS — N2581 Secondary hyperparathyroidism of renal origin: Secondary | ICD-10-CM | POA: Diagnosis not present

## 2018-11-21 DIAGNOSIS — N186 End stage renal disease: Secondary | ICD-10-CM | POA: Diagnosis not present

## 2018-11-21 DIAGNOSIS — N2581 Secondary hyperparathyroidism of renal origin: Secondary | ICD-10-CM | POA: Diagnosis not present

## 2018-11-23 DIAGNOSIS — N186 End stage renal disease: Secondary | ICD-10-CM | POA: Diagnosis not present

## 2018-11-23 DIAGNOSIS — N2581 Secondary hyperparathyroidism of renal origin: Secondary | ICD-10-CM | POA: Diagnosis not present

## 2018-11-24 DIAGNOSIS — E1022 Type 1 diabetes mellitus with diabetic chronic kidney disease: Secondary | ICD-10-CM | POA: Diagnosis not present

## 2018-11-24 DIAGNOSIS — N186 End stage renal disease: Secondary | ICD-10-CM | POA: Diagnosis not present

## 2018-11-24 DIAGNOSIS — Z992 Dependence on renal dialysis: Secondary | ICD-10-CM | POA: Diagnosis not present

## 2018-11-25 DIAGNOSIS — N2581 Secondary hyperparathyroidism of renal origin: Secondary | ICD-10-CM | POA: Diagnosis not present

## 2018-11-25 DIAGNOSIS — N186 End stage renal disease: Secondary | ICD-10-CM | POA: Diagnosis not present

## 2018-11-28 DIAGNOSIS — N186 End stage renal disease: Secondary | ICD-10-CM | POA: Diagnosis not present

## 2018-11-28 DIAGNOSIS — N2581 Secondary hyperparathyroidism of renal origin: Secondary | ICD-10-CM | POA: Diagnosis not present

## 2018-11-30 DIAGNOSIS — N2581 Secondary hyperparathyroidism of renal origin: Secondary | ICD-10-CM | POA: Diagnosis not present

## 2018-11-30 DIAGNOSIS — N186 End stage renal disease: Secondary | ICD-10-CM | POA: Diagnosis not present

## 2018-12-02 DIAGNOSIS — N186 End stage renal disease: Secondary | ICD-10-CM | POA: Diagnosis not present

## 2018-12-02 DIAGNOSIS — N2581 Secondary hyperparathyroidism of renal origin: Secondary | ICD-10-CM | POA: Diagnosis not present

## 2018-12-05 DIAGNOSIS — N2581 Secondary hyperparathyroidism of renal origin: Secondary | ICD-10-CM | POA: Diagnosis not present

## 2018-12-05 DIAGNOSIS — N186 End stage renal disease: Secondary | ICD-10-CM | POA: Diagnosis not present

## 2018-12-07 DIAGNOSIS — N2581 Secondary hyperparathyroidism of renal origin: Secondary | ICD-10-CM | POA: Diagnosis not present

## 2018-12-07 DIAGNOSIS — N186 End stage renal disease: Secondary | ICD-10-CM | POA: Diagnosis not present

## 2018-12-09 DIAGNOSIS — N2581 Secondary hyperparathyroidism of renal origin: Secondary | ICD-10-CM | POA: Diagnosis not present

## 2018-12-09 DIAGNOSIS — N186 End stage renal disease: Secondary | ICD-10-CM | POA: Diagnosis not present

## 2018-12-12 DIAGNOSIS — E8779 Other fluid overload: Secondary | ICD-10-CM | POA: Diagnosis not present

## 2018-12-12 DIAGNOSIS — N186 End stage renal disease: Secondary | ICD-10-CM | POA: Diagnosis not present

## 2018-12-12 DIAGNOSIS — N2581 Secondary hyperparathyroidism of renal origin: Secondary | ICD-10-CM | POA: Diagnosis not present

## 2018-12-14 DIAGNOSIS — N186 End stage renal disease: Secondary | ICD-10-CM | POA: Diagnosis not present

## 2018-12-14 DIAGNOSIS — N2581 Secondary hyperparathyroidism of renal origin: Secondary | ICD-10-CM | POA: Diagnosis not present

## 2018-12-14 DIAGNOSIS — E8779 Other fluid overload: Secondary | ICD-10-CM | POA: Diagnosis not present

## 2018-12-16 DIAGNOSIS — N2581 Secondary hyperparathyroidism of renal origin: Secondary | ICD-10-CM | POA: Diagnosis not present

## 2018-12-16 DIAGNOSIS — E8779 Other fluid overload: Secondary | ICD-10-CM | POA: Diagnosis not present

## 2018-12-16 DIAGNOSIS — N186 End stage renal disease: Secondary | ICD-10-CM | POA: Diagnosis not present

## 2018-12-17 DIAGNOSIS — E8779 Other fluid overload: Secondary | ICD-10-CM | POA: Diagnosis not present

## 2018-12-17 DIAGNOSIS — N2581 Secondary hyperparathyroidism of renal origin: Secondary | ICD-10-CM | POA: Diagnosis not present

## 2018-12-17 DIAGNOSIS — N186 End stage renal disease: Secondary | ICD-10-CM | POA: Diagnosis not present

## 2018-12-19 DIAGNOSIS — N186 End stage renal disease: Secondary | ICD-10-CM | POA: Diagnosis not present

## 2018-12-19 DIAGNOSIS — N2581 Secondary hyperparathyroidism of renal origin: Secondary | ICD-10-CM | POA: Diagnosis not present

## 2018-12-21 DIAGNOSIS — N186 End stage renal disease: Secondary | ICD-10-CM | POA: Diagnosis not present

## 2018-12-21 DIAGNOSIS — N2581 Secondary hyperparathyroidism of renal origin: Secondary | ICD-10-CM | POA: Diagnosis not present

## 2018-12-23 DIAGNOSIS — N186 End stage renal disease: Secondary | ICD-10-CM | POA: Diagnosis not present

## 2018-12-23 DIAGNOSIS — N2581 Secondary hyperparathyroidism of renal origin: Secondary | ICD-10-CM | POA: Diagnosis not present

## 2018-12-25 DIAGNOSIS — Z992 Dependence on renal dialysis: Secondary | ICD-10-CM | POA: Diagnosis not present

## 2018-12-25 DIAGNOSIS — E1022 Type 1 diabetes mellitus with diabetic chronic kidney disease: Secondary | ICD-10-CM | POA: Diagnosis not present

## 2018-12-25 DIAGNOSIS — N186 End stage renal disease: Secondary | ICD-10-CM | POA: Diagnosis not present

## 2018-12-26 DIAGNOSIS — N186 End stage renal disease: Secondary | ICD-10-CM | POA: Diagnosis not present

## 2018-12-26 DIAGNOSIS — N2581 Secondary hyperparathyroidism of renal origin: Secondary | ICD-10-CM | POA: Diagnosis not present

## 2018-12-28 DIAGNOSIS — N2581 Secondary hyperparathyroidism of renal origin: Secondary | ICD-10-CM | POA: Diagnosis not present

## 2018-12-28 DIAGNOSIS — N186 End stage renal disease: Secondary | ICD-10-CM | POA: Diagnosis not present

## 2018-12-30 DIAGNOSIS — N186 End stage renal disease: Secondary | ICD-10-CM | POA: Diagnosis not present

## 2018-12-30 DIAGNOSIS — N2581 Secondary hyperparathyroidism of renal origin: Secondary | ICD-10-CM | POA: Diagnosis not present

## 2019-01-02 DIAGNOSIS — N186 End stage renal disease: Secondary | ICD-10-CM | POA: Diagnosis not present

## 2019-01-02 DIAGNOSIS — Z23 Encounter for immunization: Secondary | ICD-10-CM | POA: Diagnosis not present

## 2019-01-02 DIAGNOSIS — N2581 Secondary hyperparathyroidism of renal origin: Secondary | ICD-10-CM | POA: Diagnosis not present

## 2019-01-04 DIAGNOSIS — Z23 Encounter for immunization: Secondary | ICD-10-CM | POA: Diagnosis not present

## 2019-01-04 DIAGNOSIS — N186 End stage renal disease: Secondary | ICD-10-CM | POA: Diagnosis not present

## 2019-01-04 DIAGNOSIS — N2581 Secondary hyperparathyroidism of renal origin: Secondary | ICD-10-CM | POA: Diagnosis not present

## 2019-01-06 DIAGNOSIS — N186 End stage renal disease: Secondary | ICD-10-CM | POA: Diagnosis not present

## 2019-01-06 DIAGNOSIS — N2581 Secondary hyperparathyroidism of renal origin: Secondary | ICD-10-CM | POA: Diagnosis not present

## 2019-01-06 DIAGNOSIS — Z23 Encounter for immunization: Secondary | ICD-10-CM | POA: Diagnosis not present

## 2019-01-09 DIAGNOSIS — N186 End stage renal disease: Secondary | ICD-10-CM | POA: Diagnosis not present

## 2019-01-09 DIAGNOSIS — N2581 Secondary hyperparathyroidism of renal origin: Secondary | ICD-10-CM | POA: Diagnosis not present

## 2019-01-11 DIAGNOSIS — N186 End stage renal disease: Secondary | ICD-10-CM | POA: Diagnosis not present

## 2019-01-11 DIAGNOSIS — N2581 Secondary hyperparathyroidism of renal origin: Secondary | ICD-10-CM | POA: Diagnosis not present

## 2019-01-13 DIAGNOSIS — N186 End stage renal disease: Secondary | ICD-10-CM | POA: Diagnosis not present

## 2019-01-13 DIAGNOSIS — N2581 Secondary hyperparathyroidism of renal origin: Secondary | ICD-10-CM | POA: Diagnosis not present

## 2019-01-16 DIAGNOSIS — N2581 Secondary hyperparathyroidism of renal origin: Secondary | ICD-10-CM | POA: Diagnosis not present

## 2019-01-16 DIAGNOSIS — N186 End stage renal disease: Secondary | ICD-10-CM | POA: Diagnosis not present

## 2019-01-18 DIAGNOSIS — N186 End stage renal disease: Secondary | ICD-10-CM | POA: Diagnosis not present

## 2019-01-18 DIAGNOSIS — N2581 Secondary hyperparathyroidism of renal origin: Secondary | ICD-10-CM | POA: Diagnosis not present

## 2019-01-20 DIAGNOSIS — N2581 Secondary hyperparathyroidism of renal origin: Secondary | ICD-10-CM | POA: Diagnosis not present

## 2019-01-20 DIAGNOSIS — N186 End stage renal disease: Secondary | ICD-10-CM | POA: Diagnosis not present

## 2019-01-23 DIAGNOSIS — N2581 Secondary hyperparathyroidism of renal origin: Secondary | ICD-10-CM | POA: Diagnosis not present

## 2019-01-23 DIAGNOSIS — N186 End stage renal disease: Secondary | ICD-10-CM | POA: Diagnosis not present

## 2019-01-24 DIAGNOSIS — Z992 Dependence on renal dialysis: Secondary | ICD-10-CM | POA: Diagnosis not present

## 2019-01-24 DIAGNOSIS — N186 End stage renal disease: Secondary | ICD-10-CM | POA: Diagnosis not present

## 2019-01-24 DIAGNOSIS — E1022 Type 1 diabetes mellitus with diabetic chronic kidney disease: Secondary | ICD-10-CM | POA: Diagnosis not present

## 2019-01-25 DIAGNOSIS — N2581 Secondary hyperparathyroidism of renal origin: Secondary | ICD-10-CM | POA: Diagnosis not present

## 2019-01-25 DIAGNOSIS — N186 End stage renal disease: Secondary | ICD-10-CM | POA: Diagnosis not present

## 2019-01-27 DIAGNOSIS — N2581 Secondary hyperparathyroidism of renal origin: Secondary | ICD-10-CM | POA: Diagnosis not present

## 2019-01-27 DIAGNOSIS — N186 End stage renal disease: Secondary | ICD-10-CM | POA: Diagnosis not present

## 2019-01-30 DIAGNOSIS — N186 End stage renal disease: Secondary | ICD-10-CM | POA: Diagnosis not present

## 2019-01-30 DIAGNOSIS — N2581 Secondary hyperparathyroidism of renal origin: Secondary | ICD-10-CM | POA: Diagnosis not present

## 2019-02-01 DIAGNOSIS — N2581 Secondary hyperparathyroidism of renal origin: Secondary | ICD-10-CM | POA: Diagnosis not present

## 2019-02-01 DIAGNOSIS — N186 End stage renal disease: Secondary | ICD-10-CM | POA: Diagnosis not present

## 2019-02-03 DIAGNOSIS — N2581 Secondary hyperparathyroidism of renal origin: Secondary | ICD-10-CM | POA: Diagnosis not present

## 2019-02-03 DIAGNOSIS — N186 End stage renal disease: Secondary | ICD-10-CM | POA: Diagnosis not present

## 2019-02-06 DIAGNOSIS — N2581 Secondary hyperparathyroidism of renal origin: Secondary | ICD-10-CM | POA: Diagnosis not present

## 2019-02-06 DIAGNOSIS — N186 End stage renal disease: Secondary | ICD-10-CM | POA: Diagnosis not present

## 2019-02-08 DIAGNOSIS — N2581 Secondary hyperparathyroidism of renal origin: Secondary | ICD-10-CM | POA: Diagnosis not present

## 2019-02-08 DIAGNOSIS — N186 End stage renal disease: Secondary | ICD-10-CM | POA: Diagnosis not present

## 2019-02-10 DIAGNOSIS — N2581 Secondary hyperparathyroidism of renal origin: Secondary | ICD-10-CM | POA: Diagnosis not present

## 2019-02-10 DIAGNOSIS — N186 End stage renal disease: Secondary | ICD-10-CM | POA: Diagnosis not present

## 2019-02-13 DIAGNOSIS — N2581 Secondary hyperparathyroidism of renal origin: Secondary | ICD-10-CM | POA: Diagnosis not present

## 2019-02-13 DIAGNOSIS — N186 End stage renal disease: Secondary | ICD-10-CM | POA: Diagnosis not present

## 2019-02-15 DIAGNOSIS — N2581 Secondary hyperparathyroidism of renal origin: Secondary | ICD-10-CM | POA: Diagnosis not present

## 2019-02-15 DIAGNOSIS — N186 End stage renal disease: Secondary | ICD-10-CM | POA: Diagnosis not present

## 2019-02-17 DIAGNOSIS — N186 End stage renal disease: Secondary | ICD-10-CM | POA: Diagnosis not present

## 2019-02-17 DIAGNOSIS — N2581 Secondary hyperparathyroidism of renal origin: Secondary | ICD-10-CM | POA: Diagnosis not present

## 2019-02-20 DIAGNOSIS — N186 End stage renal disease: Secondary | ICD-10-CM | POA: Diagnosis not present

## 2019-02-20 DIAGNOSIS — N2581 Secondary hyperparathyroidism of renal origin: Secondary | ICD-10-CM | POA: Diagnosis not present

## 2019-02-21 DIAGNOSIS — T82858A Stenosis of vascular prosthetic devices, implants and grafts, initial encounter: Secondary | ICD-10-CM | POA: Diagnosis not present

## 2019-02-21 DIAGNOSIS — Z992 Dependence on renal dialysis: Secondary | ICD-10-CM | POA: Diagnosis not present

## 2019-02-21 DIAGNOSIS — N186 End stage renal disease: Secondary | ICD-10-CM | POA: Diagnosis not present

## 2019-02-21 DIAGNOSIS — I871 Compression of vein: Secondary | ICD-10-CM | POA: Diagnosis not present

## 2019-02-22 DIAGNOSIS — N2581 Secondary hyperparathyroidism of renal origin: Secondary | ICD-10-CM | POA: Diagnosis not present

## 2019-02-22 DIAGNOSIS — N186 End stage renal disease: Secondary | ICD-10-CM | POA: Diagnosis not present

## 2019-02-24 DIAGNOSIS — N186 End stage renal disease: Secondary | ICD-10-CM | POA: Diagnosis not present

## 2019-02-24 DIAGNOSIS — Z992 Dependence on renal dialysis: Secondary | ICD-10-CM | POA: Diagnosis not present

## 2019-02-24 DIAGNOSIS — N2581 Secondary hyperparathyroidism of renal origin: Secondary | ICD-10-CM | POA: Diagnosis not present

## 2019-02-24 DIAGNOSIS — E1022 Type 1 diabetes mellitus with diabetic chronic kidney disease: Secondary | ICD-10-CM | POA: Diagnosis not present

## 2019-02-27 DIAGNOSIS — Z992 Dependence on renal dialysis: Secondary | ICD-10-CM | POA: Diagnosis not present

## 2019-02-27 DIAGNOSIS — N186 End stage renal disease: Secondary | ICD-10-CM | POA: Diagnosis not present

## 2019-02-27 DIAGNOSIS — N2581 Secondary hyperparathyroidism of renal origin: Secondary | ICD-10-CM | POA: Diagnosis not present

## 2019-03-01 DIAGNOSIS — Z992 Dependence on renal dialysis: Secondary | ICD-10-CM | POA: Diagnosis not present

## 2019-03-01 DIAGNOSIS — N186 End stage renal disease: Secondary | ICD-10-CM | POA: Diagnosis not present

## 2019-03-01 DIAGNOSIS — N2581 Secondary hyperparathyroidism of renal origin: Secondary | ICD-10-CM | POA: Diagnosis not present

## 2019-03-03 DIAGNOSIS — N186 End stage renal disease: Secondary | ICD-10-CM | POA: Diagnosis not present

## 2019-03-03 DIAGNOSIS — Z992 Dependence on renal dialysis: Secondary | ICD-10-CM | POA: Diagnosis not present

## 2019-03-03 DIAGNOSIS — N2581 Secondary hyperparathyroidism of renal origin: Secondary | ICD-10-CM | POA: Diagnosis not present

## 2019-03-06 DIAGNOSIS — Z992 Dependence on renal dialysis: Secondary | ICD-10-CM | POA: Diagnosis not present

## 2019-03-06 DIAGNOSIS — N186 End stage renal disease: Secondary | ICD-10-CM | POA: Diagnosis not present

## 2019-03-06 DIAGNOSIS — N2581 Secondary hyperparathyroidism of renal origin: Secondary | ICD-10-CM | POA: Diagnosis not present

## 2019-03-08 DIAGNOSIS — Z992 Dependence on renal dialysis: Secondary | ICD-10-CM | POA: Diagnosis not present

## 2019-03-08 DIAGNOSIS — N2581 Secondary hyperparathyroidism of renal origin: Secondary | ICD-10-CM | POA: Diagnosis not present

## 2019-03-08 DIAGNOSIS — N186 End stage renal disease: Secondary | ICD-10-CM | POA: Diagnosis not present

## 2019-03-10 DIAGNOSIS — N186 End stage renal disease: Secondary | ICD-10-CM | POA: Diagnosis not present

## 2019-03-10 DIAGNOSIS — Z992 Dependence on renal dialysis: Secondary | ICD-10-CM | POA: Diagnosis not present

## 2019-03-10 DIAGNOSIS — N2581 Secondary hyperparathyroidism of renal origin: Secondary | ICD-10-CM | POA: Diagnosis not present

## 2019-03-13 DIAGNOSIS — Z992 Dependence on renal dialysis: Secondary | ICD-10-CM | POA: Diagnosis not present

## 2019-03-13 DIAGNOSIS — N186 End stage renal disease: Secondary | ICD-10-CM | POA: Diagnosis not present

## 2019-03-13 DIAGNOSIS — N2581 Secondary hyperparathyroidism of renal origin: Secondary | ICD-10-CM | POA: Diagnosis not present

## 2019-03-15 DIAGNOSIS — N2581 Secondary hyperparathyroidism of renal origin: Secondary | ICD-10-CM | POA: Diagnosis not present

## 2019-03-15 DIAGNOSIS — Z992 Dependence on renal dialysis: Secondary | ICD-10-CM | POA: Diagnosis not present

## 2019-03-15 DIAGNOSIS — N186 End stage renal disease: Secondary | ICD-10-CM | POA: Diagnosis not present

## 2019-03-17 DIAGNOSIS — Z992 Dependence on renal dialysis: Secondary | ICD-10-CM | POA: Diagnosis not present

## 2019-03-17 DIAGNOSIS — N186 End stage renal disease: Secondary | ICD-10-CM | POA: Diagnosis not present

## 2019-03-17 DIAGNOSIS — N2581 Secondary hyperparathyroidism of renal origin: Secondary | ICD-10-CM | POA: Diagnosis not present

## 2019-03-20 DIAGNOSIS — N186 End stage renal disease: Secondary | ICD-10-CM | POA: Diagnosis not present

## 2019-03-20 DIAGNOSIS — N2581 Secondary hyperparathyroidism of renal origin: Secondary | ICD-10-CM | POA: Diagnosis not present

## 2019-03-20 DIAGNOSIS — Z992 Dependence on renal dialysis: Secondary | ICD-10-CM | POA: Diagnosis not present

## 2019-03-22 DIAGNOSIS — N2581 Secondary hyperparathyroidism of renal origin: Secondary | ICD-10-CM | POA: Diagnosis not present

## 2019-03-22 DIAGNOSIS — N186 End stage renal disease: Secondary | ICD-10-CM | POA: Diagnosis not present

## 2019-03-22 DIAGNOSIS — Z992 Dependence on renal dialysis: Secondary | ICD-10-CM | POA: Diagnosis not present

## 2019-03-24 DIAGNOSIS — Z992 Dependence on renal dialysis: Secondary | ICD-10-CM | POA: Diagnosis not present

## 2019-03-24 DIAGNOSIS — N2581 Secondary hyperparathyroidism of renal origin: Secondary | ICD-10-CM | POA: Diagnosis not present

## 2019-03-24 DIAGNOSIS — N186 End stage renal disease: Secondary | ICD-10-CM | POA: Diagnosis not present

## 2019-03-27 DIAGNOSIS — Z992 Dependence on renal dialysis: Secondary | ICD-10-CM | POA: Diagnosis not present

## 2019-03-27 DIAGNOSIS — E1022 Type 1 diabetes mellitus with diabetic chronic kidney disease: Secondary | ICD-10-CM | POA: Diagnosis not present

## 2019-03-27 DIAGNOSIS — N186 End stage renal disease: Secondary | ICD-10-CM | POA: Diagnosis not present

## 2019-03-27 DIAGNOSIS — N2581 Secondary hyperparathyroidism of renal origin: Secondary | ICD-10-CM | POA: Diagnosis not present

## 2019-03-29 DIAGNOSIS — N186 End stage renal disease: Secondary | ICD-10-CM | POA: Diagnosis not present

## 2019-03-29 DIAGNOSIS — Z992 Dependence on renal dialysis: Secondary | ICD-10-CM | POA: Diagnosis not present

## 2019-03-29 DIAGNOSIS — N2581 Secondary hyperparathyroidism of renal origin: Secondary | ICD-10-CM | POA: Diagnosis not present

## 2019-03-31 DIAGNOSIS — N186 End stage renal disease: Secondary | ICD-10-CM | POA: Diagnosis not present

## 2019-03-31 DIAGNOSIS — N2581 Secondary hyperparathyroidism of renal origin: Secondary | ICD-10-CM | POA: Diagnosis not present

## 2019-03-31 DIAGNOSIS — Z992 Dependence on renal dialysis: Secondary | ICD-10-CM | POA: Diagnosis not present

## 2019-04-03 DIAGNOSIS — Z992 Dependence on renal dialysis: Secondary | ICD-10-CM | POA: Diagnosis not present

## 2019-04-03 DIAGNOSIS — N2581 Secondary hyperparathyroidism of renal origin: Secondary | ICD-10-CM | POA: Diagnosis not present

## 2019-04-03 DIAGNOSIS — N186 End stage renal disease: Secondary | ICD-10-CM | POA: Diagnosis not present

## 2019-04-05 DIAGNOSIS — N2581 Secondary hyperparathyroidism of renal origin: Secondary | ICD-10-CM | POA: Diagnosis not present

## 2019-04-05 DIAGNOSIS — N186 End stage renal disease: Secondary | ICD-10-CM | POA: Diagnosis not present

## 2019-04-05 DIAGNOSIS — Z992 Dependence on renal dialysis: Secondary | ICD-10-CM | POA: Diagnosis not present

## 2019-04-07 DIAGNOSIS — N2581 Secondary hyperparathyroidism of renal origin: Secondary | ICD-10-CM | POA: Diagnosis not present

## 2019-04-07 DIAGNOSIS — N186 End stage renal disease: Secondary | ICD-10-CM | POA: Diagnosis not present

## 2019-04-07 DIAGNOSIS — Z992 Dependence on renal dialysis: Secondary | ICD-10-CM | POA: Diagnosis not present

## 2019-04-10 DIAGNOSIS — Z992 Dependence on renal dialysis: Secondary | ICD-10-CM | POA: Diagnosis not present

## 2019-04-10 DIAGNOSIS — N2581 Secondary hyperparathyroidism of renal origin: Secondary | ICD-10-CM | POA: Diagnosis not present

## 2019-04-10 DIAGNOSIS — N186 End stage renal disease: Secondary | ICD-10-CM | POA: Diagnosis not present

## 2019-04-12 DIAGNOSIS — N186 End stage renal disease: Secondary | ICD-10-CM | POA: Diagnosis not present

## 2019-04-12 DIAGNOSIS — N2581 Secondary hyperparathyroidism of renal origin: Secondary | ICD-10-CM | POA: Diagnosis not present

## 2019-04-12 DIAGNOSIS — Z992 Dependence on renal dialysis: Secondary | ICD-10-CM | POA: Diagnosis not present

## 2019-04-14 DIAGNOSIS — Z992 Dependence on renal dialysis: Secondary | ICD-10-CM | POA: Diagnosis not present

## 2019-04-14 DIAGNOSIS — N186 End stage renal disease: Secondary | ICD-10-CM | POA: Diagnosis not present

## 2019-04-14 DIAGNOSIS — N2581 Secondary hyperparathyroidism of renal origin: Secondary | ICD-10-CM | POA: Diagnosis not present

## 2019-04-17 DIAGNOSIS — Z992 Dependence on renal dialysis: Secondary | ICD-10-CM | POA: Diagnosis not present

## 2019-04-17 DIAGNOSIS — N186 End stage renal disease: Secondary | ICD-10-CM | POA: Diagnosis not present

## 2019-04-17 DIAGNOSIS — Z23 Encounter for immunization: Secondary | ICD-10-CM | POA: Diagnosis not present

## 2019-04-17 DIAGNOSIS — N2581 Secondary hyperparathyroidism of renal origin: Secondary | ICD-10-CM | POA: Diagnosis not present

## 2019-04-19 DIAGNOSIS — Z992 Dependence on renal dialysis: Secondary | ICD-10-CM | POA: Diagnosis not present

## 2019-04-19 DIAGNOSIS — N186 End stage renal disease: Secondary | ICD-10-CM | POA: Diagnosis not present

## 2019-04-19 DIAGNOSIS — N2581 Secondary hyperparathyroidism of renal origin: Secondary | ICD-10-CM | POA: Diagnosis not present

## 2019-04-19 DIAGNOSIS — Z23 Encounter for immunization: Secondary | ICD-10-CM | POA: Diagnosis not present

## 2019-04-21 DIAGNOSIS — N186 End stage renal disease: Secondary | ICD-10-CM | POA: Diagnosis not present

## 2019-04-21 DIAGNOSIS — Z23 Encounter for immunization: Secondary | ICD-10-CM | POA: Diagnosis not present

## 2019-04-21 DIAGNOSIS — Z992 Dependence on renal dialysis: Secondary | ICD-10-CM | POA: Diagnosis not present

## 2019-04-21 DIAGNOSIS — N2581 Secondary hyperparathyroidism of renal origin: Secondary | ICD-10-CM | POA: Diagnosis not present

## 2019-04-24 DIAGNOSIS — N2581 Secondary hyperparathyroidism of renal origin: Secondary | ICD-10-CM | POA: Diagnosis not present

## 2019-04-24 DIAGNOSIS — N186 End stage renal disease: Secondary | ICD-10-CM | POA: Diagnosis not present

## 2019-04-24 DIAGNOSIS — Z992 Dependence on renal dialysis: Secondary | ICD-10-CM | POA: Diagnosis not present

## 2019-04-26 DIAGNOSIS — N186 End stage renal disease: Secondary | ICD-10-CM | POA: Diagnosis not present

## 2019-04-26 DIAGNOSIS — E1022 Type 1 diabetes mellitus with diabetic chronic kidney disease: Secondary | ICD-10-CM | POA: Diagnosis not present

## 2019-04-26 DIAGNOSIS — N2581 Secondary hyperparathyroidism of renal origin: Secondary | ICD-10-CM | POA: Diagnosis not present

## 2019-04-26 DIAGNOSIS — Z992 Dependence on renal dialysis: Secondary | ICD-10-CM | POA: Diagnosis not present

## 2019-04-28 DIAGNOSIS — N2581 Secondary hyperparathyroidism of renal origin: Secondary | ICD-10-CM | POA: Diagnosis not present

## 2019-04-28 DIAGNOSIS — N186 End stage renal disease: Secondary | ICD-10-CM | POA: Diagnosis not present

## 2019-04-28 DIAGNOSIS — Z992 Dependence on renal dialysis: Secondary | ICD-10-CM | POA: Diagnosis not present

## 2019-05-01 DIAGNOSIS — N2581 Secondary hyperparathyroidism of renal origin: Secondary | ICD-10-CM | POA: Diagnosis not present

## 2019-05-01 DIAGNOSIS — N186 End stage renal disease: Secondary | ICD-10-CM | POA: Diagnosis not present

## 2019-05-01 DIAGNOSIS — Z992 Dependence on renal dialysis: Secondary | ICD-10-CM | POA: Diagnosis not present

## 2019-05-03 DIAGNOSIS — N2581 Secondary hyperparathyroidism of renal origin: Secondary | ICD-10-CM | POA: Diagnosis not present

## 2019-05-03 DIAGNOSIS — Z992 Dependence on renal dialysis: Secondary | ICD-10-CM | POA: Diagnosis not present

## 2019-05-03 DIAGNOSIS — N186 End stage renal disease: Secondary | ICD-10-CM | POA: Diagnosis not present

## 2019-05-05 DIAGNOSIS — N2581 Secondary hyperparathyroidism of renal origin: Secondary | ICD-10-CM | POA: Diagnosis not present

## 2019-05-05 DIAGNOSIS — N186 End stage renal disease: Secondary | ICD-10-CM | POA: Diagnosis not present

## 2019-05-05 DIAGNOSIS — Z992 Dependence on renal dialysis: Secondary | ICD-10-CM | POA: Diagnosis not present

## 2019-05-08 DIAGNOSIS — N186 End stage renal disease: Secondary | ICD-10-CM | POA: Diagnosis not present

## 2019-05-08 DIAGNOSIS — N2581 Secondary hyperparathyroidism of renal origin: Secondary | ICD-10-CM | POA: Diagnosis not present

## 2019-05-08 DIAGNOSIS — Z992 Dependence on renal dialysis: Secondary | ICD-10-CM | POA: Diagnosis not present

## 2019-05-10 DIAGNOSIS — Z992 Dependence on renal dialysis: Secondary | ICD-10-CM | POA: Diagnosis not present

## 2019-05-10 DIAGNOSIS — N186 End stage renal disease: Secondary | ICD-10-CM | POA: Diagnosis not present

## 2019-05-10 DIAGNOSIS — N2581 Secondary hyperparathyroidism of renal origin: Secondary | ICD-10-CM | POA: Diagnosis not present

## 2019-05-12 DIAGNOSIS — N186 End stage renal disease: Secondary | ICD-10-CM | POA: Diagnosis not present

## 2019-05-12 DIAGNOSIS — Z992 Dependence on renal dialysis: Secondary | ICD-10-CM | POA: Diagnosis not present

## 2019-05-12 DIAGNOSIS — N2581 Secondary hyperparathyroidism of renal origin: Secondary | ICD-10-CM | POA: Diagnosis not present

## 2019-05-15 DIAGNOSIS — Z992 Dependence on renal dialysis: Secondary | ICD-10-CM | POA: Diagnosis not present

## 2019-05-15 DIAGNOSIS — N2581 Secondary hyperparathyroidism of renal origin: Secondary | ICD-10-CM | POA: Diagnosis not present

## 2019-05-15 DIAGNOSIS — N186 End stage renal disease: Secondary | ICD-10-CM | POA: Diagnosis not present

## 2019-05-17 DIAGNOSIS — N2581 Secondary hyperparathyroidism of renal origin: Secondary | ICD-10-CM | POA: Diagnosis not present

## 2019-05-17 DIAGNOSIS — N186 End stage renal disease: Secondary | ICD-10-CM | POA: Diagnosis not present

## 2019-05-17 DIAGNOSIS — Z992 Dependence on renal dialysis: Secondary | ICD-10-CM | POA: Diagnosis not present

## 2019-05-19 DIAGNOSIS — N2581 Secondary hyperparathyroidism of renal origin: Secondary | ICD-10-CM | POA: Diagnosis not present

## 2019-05-19 DIAGNOSIS — Z992 Dependence on renal dialysis: Secondary | ICD-10-CM | POA: Diagnosis not present

## 2019-05-19 DIAGNOSIS — N186 End stage renal disease: Secondary | ICD-10-CM | POA: Diagnosis not present

## 2019-05-22 DIAGNOSIS — N186 End stage renal disease: Secondary | ICD-10-CM | POA: Diagnosis not present

## 2019-05-22 DIAGNOSIS — Z992 Dependence on renal dialysis: Secondary | ICD-10-CM | POA: Diagnosis not present

## 2019-05-22 DIAGNOSIS — N2581 Secondary hyperparathyroidism of renal origin: Secondary | ICD-10-CM | POA: Diagnosis not present

## 2019-05-24 DIAGNOSIS — Z992 Dependence on renal dialysis: Secondary | ICD-10-CM | POA: Diagnosis not present

## 2019-05-24 DIAGNOSIS — N186 End stage renal disease: Secondary | ICD-10-CM | POA: Diagnosis not present

## 2019-05-24 DIAGNOSIS — N2581 Secondary hyperparathyroidism of renal origin: Secondary | ICD-10-CM | POA: Diagnosis not present

## 2019-05-26 DIAGNOSIS — N186 End stage renal disease: Secondary | ICD-10-CM | POA: Diagnosis not present

## 2019-05-26 DIAGNOSIS — N2581 Secondary hyperparathyroidism of renal origin: Secondary | ICD-10-CM | POA: Diagnosis not present

## 2019-05-26 DIAGNOSIS — Z992 Dependence on renal dialysis: Secondary | ICD-10-CM | POA: Diagnosis not present

## 2019-05-27 DIAGNOSIS — Z992 Dependence on renal dialysis: Secondary | ICD-10-CM | POA: Diagnosis not present

## 2019-05-27 DIAGNOSIS — N186 End stage renal disease: Secondary | ICD-10-CM | POA: Diagnosis not present

## 2019-05-27 DIAGNOSIS — E1022 Type 1 diabetes mellitus with diabetic chronic kidney disease: Secondary | ICD-10-CM | POA: Diagnosis not present

## 2019-05-29 DIAGNOSIS — N186 End stage renal disease: Secondary | ICD-10-CM | POA: Diagnosis not present

## 2019-05-29 DIAGNOSIS — Z992 Dependence on renal dialysis: Secondary | ICD-10-CM | POA: Diagnosis not present

## 2019-05-29 DIAGNOSIS — N2581 Secondary hyperparathyroidism of renal origin: Secondary | ICD-10-CM | POA: Diagnosis not present

## 2019-05-31 DIAGNOSIS — N2581 Secondary hyperparathyroidism of renal origin: Secondary | ICD-10-CM | POA: Diagnosis not present

## 2019-05-31 DIAGNOSIS — N186 End stage renal disease: Secondary | ICD-10-CM | POA: Diagnosis not present

## 2019-05-31 DIAGNOSIS — Z992 Dependence on renal dialysis: Secondary | ICD-10-CM | POA: Diagnosis not present

## 2019-06-02 DIAGNOSIS — N186 End stage renal disease: Secondary | ICD-10-CM | POA: Diagnosis not present

## 2019-06-02 DIAGNOSIS — Z992 Dependence on renal dialysis: Secondary | ICD-10-CM | POA: Diagnosis not present

## 2019-06-02 DIAGNOSIS — N2581 Secondary hyperparathyroidism of renal origin: Secondary | ICD-10-CM | POA: Diagnosis not present

## 2019-06-05 DIAGNOSIS — Z992 Dependence on renal dialysis: Secondary | ICD-10-CM | POA: Diagnosis not present

## 2019-06-05 DIAGNOSIS — N186 End stage renal disease: Secondary | ICD-10-CM | POA: Diagnosis not present

## 2019-06-05 DIAGNOSIS — N2581 Secondary hyperparathyroidism of renal origin: Secondary | ICD-10-CM | POA: Diagnosis not present

## 2019-06-07 DIAGNOSIS — Z992 Dependence on renal dialysis: Secondary | ICD-10-CM | POA: Diagnosis not present

## 2019-06-07 DIAGNOSIS — N186 End stage renal disease: Secondary | ICD-10-CM | POA: Diagnosis not present

## 2019-06-07 DIAGNOSIS — N2581 Secondary hyperparathyroidism of renal origin: Secondary | ICD-10-CM | POA: Diagnosis not present

## 2019-06-09 DIAGNOSIS — Z992 Dependence on renal dialysis: Secondary | ICD-10-CM | POA: Diagnosis not present

## 2019-06-09 DIAGNOSIS — N186 End stage renal disease: Secondary | ICD-10-CM | POA: Diagnosis not present

## 2019-06-09 DIAGNOSIS — N2581 Secondary hyperparathyroidism of renal origin: Secondary | ICD-10-CM | POA: Diagnosis not present

## 2019-06-12 DIAGNOSIS — Z992 Dependence on renal dialysis: Secondary | ICD-10-CM | POA: Diagnosis not present

## 2019-06-12 DIAGNOSIS — N2581 Secondary hyperparathyroidism of renal origin: Secondary | ICD-10-CM | POA: Diagnosis not present

## 2019-06-12 DIAGNOSIS — N186 End stage renal disease: Secondary | ICD-10-CM | POA: Diagnosis not present

## 2019-06-14 DIAGNOSIS — Z992 Dependence on renal dialysis: Secondary | ICD-10-CM | POA: Diagnosis not present

## 2019-06-14 DIAGNOSIS — N186 End stage renal disease: Secondary | ICD-10-CM | POA: Diagnosis not present

## 2019-06-14 DIAGNOSIS — N2581 Secondary hyperparathyroidism of renal origin: Secondary | ICD-10-CM | POA: Diagnosis not present

## 2019-06-16 DIAGNOSIS — N186 End stage renal disease: Secondary | ICD-10-CM | POA: Diagnosis not present

## 2019-06-16 DIAGNOSIS — Z992 Dependence on renal dialysis: Secondary | ICD-10-CM | POA: Diagnosis not present

## 2019-06-16 DIAGNOSIS — N2581 Secondary hyperparathyroidism of renal origin: Secondary | ICD-10-CM | POA: Diagnosis not present

## 2019-06-18 DIAGNOSIS — Z992 Dependence on renal dialysis: Secondary | ICD-10-CM | POA: Diagnosis not present

## 2019-06-18 DIAGNOSIS — N186 End stage renal disease: Secondary | ICD-10-CM | POA: Diagnosis not present

## 2019-06-18 DIAGNOSIS — N2581 Secondary hyperparathyroidism of renal origin: Secondary | ICD-10-CM | POA: Diagnosis not present

## 2019-06-20 DIAGNOSIS — I871 Compression of vein: Secondary | ICD-10-CM | POA: Diagnosis not present

## 2019-06-20 DIAGNOSIS — Z992 Dependence on renal dialysis: Secondary | ICD-10-CM | POA: Diagnosis not present

## 2019-06-20 DIAGNOSIS — N2581 Secondary hyperparathyroidism of renal origin: Secondary | ICD-10-CM | POA: Diagnosis not present

## 2019-06-20 DIAGNOSIS — N186 End stage renal disease: Secondary | ICD-10-CM | POA: Diagnosis not present

## 2019-06-23 DIAGNOSIS — N2581 Secondary hyperparathyroidism of renal origin: Secondary | ICD-10-CM | POA: Diagnosis not present

## 2019-06-23 DIAGNOSIS — N186 End stage renal disease: Secondary | ICD-10-CM | POA: Diagnosis not present

## 2019-06-23 DIAGNOSIS — Z992 Dependence on renal dialysis: Secondary | ICD-10-CM | POA: Diagnosis not present

## 2019-06-26 DIAGNOSIS — N186 End stage renal disease: Secondary | ICD-10-CM | POA: Diagnosis not present

## 2019-06-26 DIAGNOSIS — Z992 Dependence on renal dialysis: Secondary | ICD-10-CM | POA: Diagnosis not present

## 2019-06-26 DIAGNOSIS — E1022 Type 1 diabetes mellitus with diabetic chronic kidney disease: Secondary | ICD-10-CM | POA: Diagnosis not present

## 2019-06-26 DIAGNOSIS — N2581 Secondary hyperparathyroidism of renal origin: Secondary | ICD-10-CM | POA: Diagnosis not present

## 2019-06-28 DIAGNOSIS — N186 End stage renal disease: Secondary | ICD-10-CM | POA: Diagnosis not present

## 2019-06-28 DIAGNOSIS — N2581 Secondary hyperparathyroidism of renal origin: Secondary | ICD-10-CM | POA: Diagnosis not present

## 2019-06-28 DIAGNOSIS — Z992 Dependence on renal dialysis: Secondary | ICD-10-CM | POA: Diagnosis not present

## 2019-06-30 DIAGNOSIS — N186 End stage renal disease: Secondary | ICD-10-CM | POA: Diagnosis not present

## 2019-06-30 DIAGNOSIS — N2581 Secondary hyperparathyroidism of renal origin: Secondary | ICD-10-CM | POA: Diagnosis not present

## 2019-06-30 DIAGNOSIS — Z992 Dependence on renal dialysis: Secondary | ICD-10-CM | POA: Diagnosis not present

## 2019-07-03 DIAGNOSIS — N2581 Secondary hyperparathyroidism of renal origin: Secondary | ICD-10-CM | POA: Diagnosis not present

## 2019-07-03 DIAGNOSIS — N186 End stage renal disease: Secondary | ICD-10-CM | POA: Diagnosis not present

## 2019-07-03 DIAGNOSIS — Z992 Dependence on renal dialysis: Secondary | ICD-10-CM | POA: Diagnosis not present

## 2019-07-05 DIAGNOSIS — N2581 Secondary hyperparathyroidism of renal origin: Secondary | ICD-10-CM | POA: Diagnosis not present

## 2019-07-05 DIAGNOSIS — Z992 Dependence on renal dialysis: Secondary | ICD-10-CM | POA: Diagnosis not present

## 2019-07-05 DIAGNOSIS — N186 End stage renal disease: Secondary | ICD-10-CM | POA: Diagnosis not present

## 2019-07-07 DIAGNOSIS — N2581 Secondary hyperparathyroidism of renal origin: Secondary | ICD-10-CM | POA: Diagnosis not present

## 2019-07-07 DIAGNOSIS — Z992 Dependence on renal dialysis: Secondary | ICD-10-CM | POA: Diagnosis not present

## 2019-07-07 DIAGNOSIS — N186 End stage renal disease: Secondary | ICD-10-CM | POA: Diagnosis not present

## 2019-07-10 DIAGNOSIS — N2581 Secondary hyperparathyroidism of renal origin: Secondary | ICD-10-CM | POA: Diagnosis not present

## 2019-07-10 DIAGNOSIS — R52 Pain, unspecified: Secondary | ICD-10-CM | POA: Diagnosis not present

## 2019-07-10 DIAGNOSIS — Z992 Dependence on renal dialysis: Secondary | ICD-10-CM | POA: Diagnosis not present

## 2019-07-10 DIAGNOSIS — N186 End stage renal disease: Secondary | ICD-10-CM | POA: Diagnosis not present

## 2019-07-11 ENCOUNTER — Encounter (HOSPITAL_COMMUNITY): Payer: Self-pay

## 2019-07-11 ENCOUNTER — Other Ambulatory Visit: Payer: Self-pay

## 2019-07-11 ENCOUNTER — Ambulatory Visit (HOSPITAL_COMMUNITY)
Admission: EM | Admit: 2019-07-11 | Discharge: 2019-07-11 | Disposition: A | Discharge status: Home / Self Care | Payer: BC Managed Care – PPO | Attending: Emergency Medicine | Admitting: Emergency Medicine

## 2019-07-11 DIAGNOSIS — K5900 Constipation, unspecified: Secondary | ICD-10-CM

## 2019-07-11 DIAGNOSIS — M5430 Sciatica, unspecified side: Secondary | ICD-10-CM | POA: Diagnosis not present

## 2019-07-11 DIAGNOSIS — M549 Dorsalgia, unspecified: Secondary | ICD-10-CM

## 2019-07-11 MED ORDER — POLYETHYLENE GLYCOL 3350 17 G PO PACK: 17.0000 g | PACK | Freq: Every day | ORAL | 0 refills | Status: DC

## 2019-07-11 MED ORDER — IBUPROFEN 600 MG PO TABS: 600.0000 mg | ORAL_TABLET | Freq: Four times a day (QID) | ORAL | 0 refills | Status: DC | PRN

## 2019-07-11 MED ORDER — CYCLOBENZAPRINE HCL 5 MG PO TABS: 5.0000 mg | ORAL_TABLET | Freq: Three times a day (TID) | ORAL | 0 refills | Status: DC | PRN

## 2019-07-11 NOTE — ED Triage Notes (Signed)
Pt states he has been having back pain that's radiating down his legs. Pt states he having rectal pain x 3 days.

## 2019-07-11 NOTE — Discharge Instructions (Addendum)
  Rest, ice and heat as needed Ensure adequate range of motion as tolerated. Injuries all appear to be muscular in nature at this time Prescribed ibuprofen as needed for inflammation and pain relief.  DO NOT TAKE WITH OTHER antiinflammatories, as this may cause GI upset and/or bleed Prescribed flexeril as needed at bedtime for muscle spasm.  Do not drive or operate heavy machinery while taking this medication Return to the urgent care for worsening symptoms

## 2019-07-11 NOTE — ED Notes (Signed)
165/79 reported to [employer].

## 2019-07-11 NOTE — ED Provider Notes (Signed)
[place]    CSN: [id number] Arrival date & time: 07/11/19  0802      History   Chief Complaint Chief Complaint  Patient presents with  . Back Pain  . rectal pain    HPI William Pittman is a 53 y.o. male.   53 years old male history of chronic kidney disease presents to the urgent care with a complaint of back pain that radiates to his lower extremities x1 day.  Denies trauma to his lower extremities, injury.  He has not used any medication for symptom.  Patient states walking make his symptoms worse.  He rated pain at 8 on a scale of 1-10.  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  He is not sure what is the cause of his symptoms. He stated he is constipated causing him to have hard stool at time. LBM was 12/14  The history is provided by the patient. No language interpreter was used.    Past Medical History:  Diagnosis Date  . Anemia   . Chronic kidney disease   . Diabetes mellitus without complication ([place])    Type II  . Hematuria   . Hyperlipidemia   . Hyperparathyroidism ([place])   . Hypertension   . Proteinuria   . Tuberculosis    20 years ago    There are no problems to display for this patient.   Past Surgical History:  Procedure Laterality Date  . AV FISTULA PLACEMENT Left 02/08/2018   Procedure: BASCILIC VEIN TRANSPOSITION 1ST STAGE;  Surgeon: Waynetta Sandy, MD;  Location: [place];  Service: Vascular;  Laterality: Left;  . BASCILIC VEIN TRANSPOSITION Left 03/30/2018   Procedure: BASILIC VEIN TRANSPOSITION SECOND STAGE;  Surgeon: Waynetta Sandy, MD;  Location: [place];  Service: Vascular;  Laterality: Left;  . COLONOSCOPY    . CYST EXCISION     arm  . EYE SURGERY Bilateral 2015       Home Medications    Prior to Admission medications   Medication Sig Start Date End Date Taking? Authorizing Provider  amLODipine (NORVASC) 10 MG tablet Take 10 mg by mouth daily.    [provider]  aspirin  81 MG tablet Take 81 mg by mouth daily.    [provider]  atorvastatin (LIPITOR) 80 MG tablet Take 80 mg by mouth daily.    [provider]  calcitRIOL (ROCALTROL) 0.25 MCG capsule Take 0.25 mcg by mouth daily.    [provider]  cyclobenzaprine (FLEXERIL) 5 MG tablet Take 1 tablet (5 mg total) by mouth 3 (three) times daily as needed for muscle spasms. 07/11/19   Katanya Schlie, Darrelyn Hillock, FNP  Dulaglutide (TRULICITY) 1.5 0000000 SOPN Inject 1.5 mg into the skin once a week. Saturday    [provider]  hydrALAZINE (APRESOLINE) 50 MG tablet Take 50 mg by mouth 2 (two) times daily.    [provider]  ibuprofen (ADVIL) 600 MG tablet Take 1 tablet (600 mg total) by mouth every 6 (six) hours as needed. 07/11/19   Jefferson Fullam, Darrelyn Hillock, FNP  insulin glargine (LANTUS) 100 UNIT/ML injection Inject 40 Units into the skin at bedtime.     [provider]  oxyCODONE-acetaminophen (PERCOCET) 5-325 MG tablet Take 1 tablet by mouth every 6 (six) hours as needed for severe pain. Patient not taking: Reported on 04/15/2018 03/30/18   Ulyses Amor, PA-C  polyethylene glycol (MIRALAX) 17 g packet Take 17 g by mouth daily. 07/11/19  Emerson Monte, FNP  torsemide (DEMADEX) 20 MG tablet Take 40 mg by mouth every morning.     [provider]    Family History Family History  Problem Relation Age of Onset  . Hyperlipidemia Other   . Hypertension Other   . Diabetes Mother     Social History Social History   Tobacco Use  . Smoking status: Never Smoker  . Smokeless tobacco: Never Used  Substance Use Topics  . Alcohol use: Yes    Comment: occ  . Drug use: Never     Allergies   Patient has no known allergies.   Review of Systems Review of Systems  Constitutional: Negative.   HENT: Negative.   Respiratory: Negative.   Cardiovascular: Negative.   Musculoskeletal: Positive for back pain.  Ros: All other are negatives  Physical Exam  Triage Vital Signs ED Triage Vitals  Enc Vitals Group     BP 07/11/19 0832 (!) 165/79     Pulse Rate 07/11/19 0832 91     Resp 07/11/19 0832 18     Temp 07/11/19 0832 99.1 F (37.3 C)     Temp Source 07/11/19 0832 Oral     SpO2 07/11/19 0832 98 %     Weight 07/11/19 0829 176 lb 6.4 oz (80 kg)     Height --      Head Circumference --      Peak Flow --      Pain Score 07/11/19 0829 6     Pain Loc --      Pain Edu? --      Excl. in [place]? --    No data found.  Updated Vital Signs BP (!) 165/79 (BP Location: Right Arm)   Pulse 91   Temp 99.1 F (37.3 C) (Oral)   Resp 18   Wt 176 lb 6.4 oz (80 kg)   SpO2 98%   BMI 28.47 kg/m   Visual Acuity Right Eye Distance:   Left Eye Distance:   Bilateral Distance:    Right Eye Near:   Left Eye Near:    Bilateral Near:     Physical Exam Vitals and nursing note reviewed.  Constitutional:      General: He is not in acute distress.    Appearance: Normal appearance. He is not ill-appearing.  Cardiovascular:     Rate and Rhythm: Normal rate and regular rhythm.     Pulses: Normal pulses.     Heart sounds: Normal heart sounds. No murmur.  Pulmonary:     Effort: Pulmonary effort is normal. No respiratory distress.     Breath sounds: Normal breath sounds. No wheezing.  Abdominal:     General: Abdomen is flat. Bowel sounds are normal. There is no distension.     Palpations: Abdomen is soft. There is no mass.     Tenderness: There is no abdominal tenderness.     Hernia: No hernia is present.  Neurological:     General: No focal deficit present.     Mental Status: He is alert and oriented to person, place, and time.     Cranial Nerves: Cranial nerves are intact.     Sensory: Sensation is intact. No sensory deficit.     Motor: Motor function is intact.     Coordination: Coordination is intact.     Gait: Gait is intact.     Deep Tendon Reflexes:     Reflex Scores:      Patellar reflexes are 2+ on  the right side and 2+ on the left  side.     UC Treatments / Results  Labs (all labs ordered are listed, but only abnormal results are displayed) Labs Reviewed - No data to display  EKG   Radiology No results found.  Procedures Procedures (including critical care time)  Medications Ordered in UC Medications - No data to display  Initial Impression / Assessment and Plan / UC Course  I have reviewed the triage vital signs and the nursing notes.  Pertinent labs & imaging results that were available during my care of the patient were reviewed by me and considered in my medical decision making (see chart for details).   Patient stable for discharge.  Benign physical exam.  Flexeril ibuprofen was prescribed to manage his back pain.  MiraLAX for constipation.  Advised patient to follow-up with PCP or to return if symptoms get worse   Final Clinical Impressions(s) / UC Diagnoses   Final diagnoses:  Back pain with sciatica  Constipation, unspecified constipation type     Discharge Instructions      Rest, ice and heat as needed Ensure adequate range of motion as tolerated. Injuries all appear to be muscular in nature at this time Prescribed naproxen as needed for inflammation and pain relief.  DO NOT TAKE WITH OTHER antiinflammatories, as this may cause GI upset and/or bleed Prescribed flexeril as needed at bedtime for muscle spasm.  Do not drive or operate heavy machinery while taking this medication Return to the urgent care for worsening symptoms    ED Prescriptions    Medication Sig Dispense Auth. Provider   ibuprofen (ADVIL) 600 MG tablet Take 1 tablet (600 mg total) by mouth every 6 (six) hours as needed. 30 tablet Deondria Puryear, Darrelyn Hillock, FNP   cyclobenzaprine (FLEXERIL) 5 MG tablet Take 1 tablet (5 mg total) by mouth 3 (three) times daily as needed for muscle spasms. 30 tablet Tifany Hirsch, Darrelyn Hillock, FNP   polyethylene glycol (MIRALAX) 17 g packet Take 17 g by mouth daily. 51 each Donaven Criswell, Darrelyn Hillock, FNP      PDMP not reviewed this encounter.   Emerson Monte, [place] 07/11/19 [phone]

## 2019-07-13 ENCOUNTER — Encounter (HOSPITAL_COMMUNITY): Payer: Self-pay | Admitting: Emergency Medicine

## 2019-07-13 ENCOUNTER — Emergency Department (HOSPITAL_COMMUNITY): Payer: BC Managed Care – PPO

## 2019-07-13 ENCOUNTER — Emergency Department (HOSPITAL_COMMUNITY)
Admission: EM | Admit: 2019-07-13 | Discharge: 2019-07-13 | Disposition: A | Discharge status: Home / Self Care | Payer: BC Managed Care – PPO | Attending: Emergency Medicine | Admitting: Emergency Medicine

## 2019-07-13 DIAGNOSIS — Z7982 Long term (current) use of aspirin: Secondary | ICD-10-CM | POA: Diagnosis not present

## 2019-07-13 DIAGNOSIS — K6289 Other specified diseases of anus and rectum: Secondary | ICD-10-CM | POA: Insufficient documentation

## 2019-07-13 DIAGNOSIS — Z79899 Other long term (current) drug therapy: Secondary | ICD-10-CM | POA: Diagnosis not present

## 2019-07-13 DIAGNOSIS — E1122 Type 2 diabetes mellitus with diabetic chronic kidney disease: Secondary | ICD-10-CM | POA: Diagnosis not present

## 2019-07-13 DIAGNOSIS — N189 Chronic kidney disease, unspecified: Secondary | ICD-10-CM | POA: Insufficient documentation

## 2019-07-13 DIAGNOSIS — I129 Hypertensive chronic kidney disease with stage 1 through stage 4 chronic kidney disease, or unspecified chronic kidney disease: Secondary | ICD-10-CM | POA: Insufficient documentation

## 2019-07-13 DIAGNOSIS — Z794 Long term (current) use of insulin: Secondary | ICD-10-CM | POA: Diagnosis not present

## 2019-07-13 DIAGNOSIS — R000 Tachycardia, unspecified: Secondary | ICD-10-CM | POA: Diagnosis not present

## 2019-07-13 DIAGNOSIS — K921 Melena: Secondary | ICD-10-CM | POA: Diagnosis not present

## 2019-07-13 LAB — CBC WITH DIFFERENTIAL/PLATELET
Abs Immature Granulocytes: 0.02 10*3/uL (ref 0.00–0.07)
Basophils Absolute: 0 10*3/uL (ref 0.0–0.1)
Basophils Relative: 0 %
Eosinophils Absolute: 0 10*3/uL (ref 0.0–0.5)
Eosinophils Relative: 0 %
HCT: 37.7 % — ABNORMAL LOW (ref 39.0–52.0)
Hemoglobin: 12.7 g/dL — ABNORMAL LOW (ref 13.0–17.0)
Immature Granulocytes: 0 %
Lymphocytes Relative: 17 %
Lymphs Abs: 0.8 10*3/uL (ref 0.7–4.0)
MCH: 29.8 pg (ref 26.0–34.0)
MCHC: 33.7 g/dL (ref 30.0–36.0)
MCV: 88.5 fL (ref 80.0–100.0)
Monocytes Absolute: 0.5 10*3/uL (ref 0.1–1.0)
Monocytes Relative: 10 %
Neutro Abs: 3.4 10*3/uL (ref 1.7–7.7)
Neutrophils Relative %: 73 %
Platelets: 393 10*3/uL (ref 150–400)
RBC: 4.26 MIL/uL (ref 4.22–5.81)
RDW: 13.6 % (ref 11.5–15.5)
WBC: 4.8 10*3/uL (ref 4.0–10.5)
nRBC: 0 % (ref 0.0–0.2)

## 2019-07-13 LAB — COMPREHENSIVE METABOLIC PANEL
ALT: 19 U/L (ref 0–44)
AST: 12 U/L — ABNORMAL LOW (ref 15–41)
Albumin: 3.3 g/dL — ABNORMAL LOW (ref 3.5–5.0)
Alkaline Phosphatase: 79 U/L (ref 38–126)
Anion gap: 16 — ABNORMAL HIGH (ref 5–15)
BUN: 75 mg/dL — ABNORMAL HIGH (ref 6–20)
CO2: 26 mmol/L (ref 22–32)
Calcium: 8.9 mg/dL (ref 8.9–10.3)
Chloride: 92 mmol/L — ABNORMAL LOW (ref 98–111)
Creatinine, Ser: 11.99 mg/dL — ABNORMAL HIGH (ref 0.61–1.24)
GFR calc Af Amer: 5 mL/min — ABNORMAL LOW (ref >60)
GFR calc non Af Amer: 4 mL/min — ABNORMAL LOW (ref >60)
Glucose, Bld: 143 mg/dL — ABNORMAL HIGH (ref 70–99)
Potassium: 4.2 mmol/L (ref 3.5–5.1)
Sodium: 134 mmol/L — ABNORMAL LOW (ref 135–145)
Total Bilirubin: 0.3 mg/dL (ref 0.3–1.2)
Total Protein: 7 g/dL (ref 6.5–8.1)

## 2019-07-13 LAB — POC OCCULT BLOOD, ED: Fecal Occult Bld: POSITIVE — AB

## 2019-07-13 MED ORDER — HYDROCORTISONE ACETATE 25 MG RE SUPP: 25.0000 mg | Freq: Two times a day (BID) | RECTAL | 0 refills | Status: DC

## 2019-07-13 NOTE — ED Notes (Signed)
Patient verbalizes understanding of discharge instructions. Opportunity for questioning and answers were provided. Pt discharged from ED. 

## 2019-07-13 NOTE — ED Triage Notes (Signed)
Pt to ER for persistent hemorrhoid related pain. Was seen on 12/15 and given prescriptions. States, they aren't helping. Pain with bowel movement this morning, but reports normal stool.

## 2019-07-13 NOTE — Discharge Instructions (Addendum)
As discussed, your CT scan was negative for any infection or anything wrong with your rectum. Your labs were a little off due to your missed dialysis. I am sending you home with a suppository to help with pain. You may use it twice a day as needed for pain. I have included the number of a GI doctor. Call today to schedule an appointment for further evaluation. You may also take over the counter ibuprofen or tylenol as needed for pain. Continue to take Miralax daily. Return to the ER for new or worsening symptoms.

## 2019-07-13 NOTE — ED Provider Notes (Signed)
[facility] EMERGENCY DEPARTMENT Provider Note   CSN: [id number] Arrival date & time: 07/13/19  1037     History Chief Complaint  Patient presents with  . Hemorrhoids    William Pittman is a 53 y.o. male with a past medical history significant for anemia, chronic kidney disease on dialysis Monday Wednesday Friday with last dialysis on Monday, diabetes mellitus, hyperlipidemia, hyperparathyroidism, hypertension, and history of TB who presents to the ED due to severe, 9/10 rectal pain for the past few days that has gradually worsened.  Patient denies bright red blood per the rectum.  Patient states his rectum pain is worse when sitting and moving.  Patient denies rectal pain with bowel movements. Patient was seen at [facility] on 12/15 for constipation and low back pain and was prescribed Miralax. Patient has tried MiraLAX, ice packs, and over-the-counter ointment with no relief.  Patient denies fever and chills.  Patient denies urinary symptoms.  Patient denies penile symptoms. Patient is monogamous with his wife with no concerns for STDs. Patient's last BM was this morning which was normal. Patient denies abdominal pain, but admits to mild abdominal distention given he missed dialysis yesterday due to rectal pain. Patient admits to black stool ever since starting dialysis a little over a year ago. Patient denies chest pain and shortness of breath. He is still making his normal amount of urinary and denies urinary symptoms.   Past Medical History:  Diagnosis Date  . Anemia   . Chronic kidney disease   . Diabetes mellitus without complication ([place])    Type II  . Hematuria   . Hyperlipidemia   . Hyperparathyroidism ([place])   . Hypertension   . Proteinuria   . Tuberculosis    20 years ago    There are no problems to display for this patient.   Past Surgical History:  Procedure Laterality Date  . AV FISTULA PLACEMENT Left 02/08/2018   Procedure: BASCILIC VEIN  TRANSPOSITION 1ST STAGE;  Surgeon: Waynetta Sandy, MD;  Location: [facility];  Service: Vascular;  Laterality: Left;  . BASCILIC VEIN TRANSPOSITION Left 03/30/2018   Procedure: BASILIC VEIN TRANSPOSITION SECOND STAGE;  Surgeon: Waynetta Sandy, MD;  Location: [facility];  Service: Vascular;  Laterality: Left;  . COLONOSCOPY    . CYST EXCISION     arm  . EYE SURGERY Bilateral 2015       Family History  Problem Relation Age of Onset  . Hyperlipidemia Other   . Hypertension Other   . Diabetes Mother     Social History   Tobacco Use  . Smoking status: Never Smoker  . Smokeless tobacco: Never Used  Substance Use Topics  . Alcohol use: Yes    Comment: occ  . Drug use: Never    Home Medications Prior to Admission medications   Medication Sig Start Date End Date Taking? Authorizing Provider  amLODipine (NORVASC) 10 MG tablet Take 10 mg by mouth daily.    [provider]  aspirin 81 MG tablet Take 81 mg by mouth daily.    [provider]  atorvastatin (LIPITOR) 80 MG tablet Take 80 mg by mouth daily.    [provider]  calcitRIOL (ROCALTROL) 0.25 MCG capsule Take 0.25 mcg by mouth daily.    [provider]  cyclobenzaprine (FLEXERIL) 5 MG tablet Take 1 tablet (5 mg total) by mouth 3 (three) times daily as needed for muscle spasms. 07/11/19   Avegno, Darrelyn Hillock, FNP  Dulaglutide (TRULICITY)  1.5 MG/0.5ML SOPN Inject 1.5 mg into the skin once a week. Saturday    [provider]  hydrALAZINE (APRESOLINE) 50 MG tablet Take 50 mg by mouth 2 (two) times daily.    [provider]  hydrocortisone (ANUSOL-HC) 25 MG suppository Place 1 suppository (25 mg total) rectally 2 (two) times daily. 07/13/19   Suzy Bouchard, PA-C  ibuprofen (ADVIL) 600 MG tablet Take 1 tablet (600 mg total) by mouth every 6 (six) hours as needed. 07/11/19   Avegno, Darrelyn Hillock, FNP  insulin glargine (LANTUS) 100 UNIT/ML injection Inject 40 Units into  the skin at bedtime.     [provider]  oxyCODONE-acetaminophen (PERCOCET) 5-325 MG tablet Take 1 tablet by mouth every 6 (six) hours as needed for severe pain. Patient not taking: Reported on 04/15/2018 03/30/18   Ulyses Amor, PA-C  polyethylene glycol (MIRALAX) 17 g packet Take 17 g by mouth daily. 07/11/19   Avegno, Darrelyn Hillock, FNP  torsemide (DEMADEX) 20 MG tablet Take 40 mg by mouth every morning.     [provider]    Allergies    Patient has no known allergies.  Review of Systems   Review of Systems  Constitutional: Negative for chills and fever.  Respiratory: Negative for shortness of breath.   Cardiovascular: Negative for chest pain.  Gastrointestinal: Positive for abdominal distention and rectal pain. Negative for abdominal pain, anal bleeding, blood in stool, diarrhea, nausea and vomiting.  Genitourinary: Negative for discharge, penile pain, penile swelling and testicular pain.  Musculoskeletal: Negative for back pain.  All other systems reviewed and are negative.   Physical Exam Updated Vital Signs BP (!) 181/81 (BP Location: Left Arm)   Pulse (!) 108   Temp 97.8 F (36.6 C) (Oral)   Resp 15   SpO2 100%   Physical Exam Vitals and nursing note reviewed.  Constitutional:      General: He is not in acute distress.    Appearance: He is not toxic-appearing.  HENT:     Head: Normocephalic.  Eyes:     Pupils: Pupils are equal, round, and reactive to light.  Cardiovascular:     Rate and Rhythm: Regular rhythm. Tachycardia present.     Pulses: Normal pulses.     Heart sounds: Normal heart sounds. No murmur. No friction rub. No gallop.   Pulmonary:     Effort: Pulmonary effort is normal.     Breath sounds: Normal breath sounds.  Abdominal:     General: Abdomen is flat. Bowel sounds are normal. There is distension.     Palpations: Abdomen is soft.     Tenderness: There is no abdominal tenderness. There is no right CVA tenderness, left CVA  tenderness, guarding or rebound.     Comments: Abdomen appears slightly distended  Genitourinary:    Rectum: Guaiac result positive. Tenderness present. No anal fissure or external hemorrhoid. Normal anal tone.     Comments: Severe tenderness in internal portion of rectum. Black watery stool. No abscess palpable. No visible hemorrhoids or fissures.  Musculoskeletal:     Cervical back: Neck supple.     Right lower leg: No edema.     Left lower leg: No edema.  Skin:    General: Skin is warm.  Neurological:     General: No focal deficit present.     Mental Status: He is alert.     ED Results / Procedures / Treatments   Labs (all labs ordered are listed, but only  abnormal results are displayed) Labs Reviewed  CBC WITH DIFFERENTIAL/PLATELET - Abnormal; Notable for the following components:      Result Value   Hemoglobin 12.7 (*)    HCT 37.7 (*)    All other components within normal limits  COMPREHENSIVE METABOLIC PANEL - Abnormal; Notable for the following components:   Sodium 134 (*)    Chloride 92 (*)    Glucose, Bld 143 (*)    BUN 75 (*)    Creatinine, Ser 11.99 (*)    Albumin 3.3 (*)    AST 12 (*)    GFR calc non Af Amer 4 (*)    GFR calc Af Amer 5 (*)    Anion gap 16 (*)    All other components within normal limits  POC OCCULT BLOOD, ED - Abnormal; Notable for the following components:   Fecal Occult Bld POSITIVE (*)    All other components within normal limits    EKG None  Radiology CT ABDOMEN PELVIS WO CONTRAST  Result Date: 07/13/2019 CLINICAL DATA:  [facility] EXAM: CT ABDOMEN AND PELVIS WITHOUT CONTRAST TECHNIQUE: Multidetector CT imaging of the abdomen and pelvis was performed following the standard protocol without IV contrast. COMPARISON:  05/16/2013 FINDINGS: Lower chest: Mild peribronchial inflammatory changes in the right lower lobe could suggest focal bronchitis or early bronchopneumonia. No focal infiltrate or effusion. No worrisome pulmonary lesions. The  heart is normal in size. A few scattered coronary artery calcifications are noted. The distal esophagus is unremarkable. There is a small hiatal hernia. Hepatobiliary: No focal hepatic lesions are identified. The gallbladder appears normal. No intra or extrahepatic biliary dilatation. Pancreas: No mass, inflammation or ductal dilatation. Spleen: Normal size.  No focal lesions. Adrenals/Urinary Tract: The adrenal glands are normal. Small scattered renal artery calcifications but no definite renal calculi. No hydroureteronephrosis. No obstructing ureteral calculi. No worrisome renal or bladder lesions without contrast. Stomach/Bowel: The stomach, duodenum, small bowel and colon are grossly normal. No acute inflammatory changes, mass lesions or obstructive findings. The terminal ileum and appendix are normal. Vascular/Lymphatic: The aorta is normal in caliber. Scattered distal aortic calcifications and iliac artery calcifications. Branch vessel calcifications including mid SMA calcifications and fairly significant appearing IMA calcifications. Recommend correlation with any symptoms of intestinal angina. Reproductive: The prostate gland and seminal vesicles are unremarkable. Other: Small periumbilical abdominal wall hernia containing fat. No inguinal hernia or inguinal adenopathy. Age advanced common femoral artery and profunda femoral artery and branch vessel calcifications are noted. Musculoskeletal: No significant bony findings. IMPRESSION: 1. Age advanced small-vessel arterial calcifications consistent with known diabetes. 2. No acute abdominal/pelvic findings, mass lesions or adenopathy are identified without contrast. 3. Minimal patchy peribronchial inflammatory changes in the right lower lobe could be inflammatory process/bronchitis or early bronchopneumonia. 4. No significant bowel findings. Electronically Signed   By: Marijo Sanes M.D.   On: 07/13/2019 12:42    Procedures Procedures (including critical  care time)  Medications Ordered in ED Medications - No data to display  ED Course  I have reviewed the triage vital signs and the nursing notes.  Pertinent labs & imaging results that were available during my care of the patient were reviewed by me and considered in my medical decision making (see chart for details).     53 year old male presents to the ED for an evaluation of rectal pain.  Upon arrival, patient is tachycardic at 108 with elevated BP at 181/81 likely due to pain and missed dialysis. Will continue to monitor. Patient in  no acute distress and non-toxic appearing. Recal exam positive for severe tenderness. Fecal occult test positive. No visible hemorrhoids or fissures. Abdomen mildly distended with no tenderness likely due to missed dialysis yesterday. Will obtain routine labs and CT abdomen and pelvic given severe rectal pain to assess for possible deep abscess or infection given his severe tenderness. Unable to do contrast due to CKD.  CBC reassuring with no leukocytosis.  Mild anemia with hemoglobin at 12.7 which appears better than baseline. CMP remarkable for hyponatremia at 134, hyperglycemia 143, worsening kidney failure with creatinine at 11.9 and BUN at 7.5, and anion gap of 16 likely due to missed dialysis.  Potassium within normal limits at 4.2. CT scan personally reviewed which demonstrates: 1. Age advanced small-vessel arterial calcifications consistent with  known diabetes.  2. No acute abdominal/pelvic findings, mass lesions or adenopathy  are identified without contrast.  3. Minimal patchy peribronchial inflammatory changes in the right  lower lobe could be inflammatory process/bronchitis or early  bronchopneumonia.  4. No significant bowel findings.   Given patient is not symptomatic for pneumonia or bronchitis, will not treat at this time. Patient is asymptomatic for uremia, so do not feel emergent dialysis is warranted at this time. Discussed with patient the  need to go to dialysis tomorrow. Given there is no abscess or rectal findings, suspect pain could be do to internal hemorrhoid or fissure that was not visualized on exam. Will discharge patient home with Anusol suppository as needed for pain. GI number given to patient at discharge. Patient advised he can take over the counter ibuprofen as needed for pain. Strict ED precautions discussed with patient. Patient states understanding and agrees to plan. Patient discharged home in no acute distress and stable vitals.  Discussed case with Dr. Sherry Ruffing who agrees with assessment and plan.  MDM Rules/Calculators/A&P                       Final Clinical Impression(s) / ED Diagnoses Final diagnoses:  Rectal pain    Rx / DC Orders ED Discharge Orders         Ordered    hydrocortisone (ANUSOL-HC) 25 MG suppository  2 times daily     07/13/19 1324           Suzy Bouchard, [place] 07/13/19 1728    Tegeler, Gwenyth Allegra, MD 07/14/19 [phone]

## 2019-07-14 DIAGNOSIS — N2581 Secondary hyperparathyroidism of renal origin: Secondary | ICD-10-CM | POA: Diagnosis not present

## 2019-07-14 DIAGNOSIS — R52 Pain, unspecified: Secondary | ICD-10-CM | POA: Diagnosis not present

## 2019-07-14 DIAGNOSIS — N186 End stage renal disease: Secondary | ICD-10-CM | POA: Diagnosis not present

## 2019-07-14 DIAGNOSIS — Z992 Dependence on renal dialysis: Secondary | ICD-10-CM | POA: Diagnosis not present

## 2019-07-17 DIAGNOSIS — N2581 Secondary hyperparathyroidism of renal origin: Secondary | ICD-10-CM | POA: Diagnosis not present

## 2019-07-17 DIAGNOSIS — N186 End stage renal disease: Secondary | ICD-10-CM | POA: Diagnosis not present

## 2019-07-17 DIAGNOSIS — Z992 Dependence on renal dialysis: Secondary | ICD-10-CM | POA: Diagnosis not present

## 2019-07-18 ENCOUNTER — Other Ambulatory Visit: Payer: Self-pay

## 2019-07-18 ENCOUNTER — Encounter (HOSPITAL_COMMUNITY): Payer: Self-pay

## 2019-07-18 ENCOUNTER — Ambulatory Visit (HOSPITAL_COMMUNITY)
Admission: EM | Admit: 2019-07-18 | Discharge: 2019-07-18 | Disposition: A | Discharge status: Home / Self Care | Payer: BC Managed Care – PPO | Attending: Family Medicine | Admitting: Family Medicine

## 2019-07-18 DIAGNOSIS — M545 Low back pain, unspecified: Secondary | ICD-10-CM

## 2019-07-18 DIAGNOSIS — Z0289 Encounter for other administrative examinations: Secondary | ICD-10-CM

## 2019-07-18 NOTE — ED Triage Notes (Signed)
Pt. States he was seen here on 07/11/2019 for back pain/rectal pain. Needs a work note saying its ok for him to work. He denies ANY symptoms today.

## 2019-07-18 NOTE — Discharge Instructions (Signed)
Return as needed

## 2019-07-18 NOTE — ED Provider Notes (Signed)
[name]    CSN: [id number] Arrival date & time: 07/18/19  1127      History   Chief Complaint Chief Complaint  Patient presents with  . Work Note    HPI William Pittman is a 53 y.o. male.   HPI Patient was seen on 12/15 and 07/13/2019.  He had back pain and then rectal pain.  He missed few days from work.  He went back to work yesterday.  He worked part of the day and then was told that he needed to have a note to return to full duty.  He is here today requesting a work note.  He states he feels well.  Back to his usual self.  He works part-time.  He thinks he can do his full duty.  His back pain is resolved.  No pain into the legs.  No numbness or weakness.  He is not taking any medicine that would impair his thinking or alertness.  He was placed on ibuprofen.  He has been told by his nephrologist not to take this medicine Past Medical History:  Diagnosis Date  . Anemia   . Chronic kidney disease   . Diabetes mellitus without complication ([place])    Type II  . Hematuria   . Hyperlipidemia   . Hyperparathyroidism ([place])   . Hypertension   . Proteinuria   . Tuberculosis    20 years ago    There are no problems to display for this patient.   Past Surgical History:  Procedure Laterality Date  . AV FISTULA PLACEMENT Left 02/08/2018   Procedure: BASCILIC VEIN TRANSPOSITION 1ST STAGE;  Surgeon: Waynetta Sandy, MD;  Location: [facility];  Service: Vascular;  Laterality: Left;  . BASCILIC VEIN TRANSPOSITION Left 03/30/2018   Procedure: BASILIC VEIN TRANSPOSITION SECOND STAGE;  Surgeon: Waynetta Sandy, MD;  Location: [facility];  Service: Vascular;  Laterality: Left;  . COLONOSCOPY    . CYST EXCISION     arm  . EYE SURGERY Bilateral 2015       Home Medications    Prior to Admission medications   Medication Sig Start Date End Date Taking? Authorizing Provider  amLODipine (NORVASC) 10 MG tablet Take 10 mg by mouth daily.    [provider]  aspirin 81 MG tablet Take 81 mg by mouth daily.    [provider]  atorvastatin (LIPITOR) 80 MG tablet Take 80 mg by mouth daily.    [provider]  calcitRIOL (ROCALTROL) 0.25 MCG capsule Take 0.25 mcg by mouth daily.    [provider]  cyclobenzaprine (FLEXERIL) 5 MG tablet Take 1 tablet (5 mg total) by mouth 3 (three) times daily as needed for muscle spasms. 07/11/19   Avegno, Darrelyn Hillock, FNP  Dulaglutide (TRULICITY) 1.5 0000000 SOPN Inject 1.5 mg into the skin once a week. Saturday    [provider]  hydrALAZINE (APRESOLINE) 50 MG tablet Take 50 mg by mouth 2 (two) times daily.    [provider]  hydrocortisone (ANUSOL-HC) 25 MG suppository Place 1 suppository (25 mg total) rectally 2 (two) times daily. 07/13/19   Suzy Bouchard, PA-C  insulin glargine (LANTUS) 100 UNIT/ML injection Inject 40 Units into the skin at bedtime.     [provider]  polyethylene glycol (MIRALAX) 17 g packet Take 17 g by mouth daily. 07/11/19   Avegno, Darrelyn Hillock, FNP  torsemide (DEMADEX) 20 MG tablet Take 40 mg by mouth every morning.  [provider]    Family History Family History  Problem Relation Age of Onset  . Hyperlipidemia Other   . Hypertension Other   . Diabetes Mother   . Healthy Father     Social History Social History   Tobacco Use  . Smoking status: Never Smoker  . Smokeless tobacco: Never Used  Substance Use Topics  . Alcohol use: Yes    Comment: occ  . Drug use: Never     Allergies   Patient has no known allergies.   Review of Systems Review of Systems  Constitutional: Negative for chills and fever.  HENT: Negative for congestion and hearing loss.   Eyes: Negative for pain.  Respiratory: Negative for cough and shortness of breath.   Cardiovascular: Negative for chest pain and leg swelling.  Gastrointestinal: Negative for abdominal pain, constipation and diarrhea.  Genitourinary:  Negative for dysuria and frequency.  Musculoskeletal: Negative for myalgias.  Neurological: Negative for dizziness, seizures and headaches.  Psychiatric/Behavioral: The patient is not nervous/anxious.      Physical Exam Triage Vital Signs ED Triage Vitals  Enc Vitals Group     BP 07/18/19 1138 (!) 164/96     Pulse Rate 07/18/19 1138 (!) 103     Resp 07/18/19 1138 17     Temp 07/18/19 1138 98.2 F (36.8 C)     Temp Source 07/18/19 1138 Oral     SpO2 07/18/19 1138 96 %     Weight 07/18/19 1135 171 lb (77.6 kg)     Height --      Head Circumference --      Peak Flow --      Pain Score 07/18/19 1154 0     Pain Loc --      Pain Edu? --      Excl. in [place]? --    No data found.  Updated Vital Signs BP (!) 164/96 (BP Location: Right Arm)   Pulse (!) 103   Temp 98.2 F (36.8 C) (Oral)   Resp 17   Wt 77.6 kg   SpO2 96%   BMI 27.60 kg/m      Physical Exam Constitutional:      General: He is not in acute distress.    Appearance: He is well-developed and normal weight.     Comments: Appears well.  Normal gait movements  HENT:     Head: Normocephalic and atraumatic.  Eyes:     Conjunctiva/sclera: Conjunctivae normal.     Pupils: Pupils are equal, round, and reactive to light.  Cardiovascular:     Rate and Rhythm: Normal rate.  Pulmonary:     Effort: Pulmonary effort is normal. No respiratory distress.  Abdominal:     General: There is no distension.     Palpations: Abdomen is soft.  Musculoskeletal:        General: Normal range of motion.     Cervical back: Normal range of motion.  Skin:    General: Skin is warm and dry.  Neurological:     General: No focal deficit present.     Mental Status: He is alert.     Gait: Gait normal.  Psychiatric:        Mood and Affect: Mood normal.        Behavior: Behavior normal.      UC Treatments / Results  Labs (all labs ordered are listed, but only abnormal results are displayed) Labs Reviewed - No data to display  EKG  Radiology No results found.  Procedures Procedures (including critical care time)  Medications Ordered in UC Medications - No data to display  Initial Impression / Assessment and Plan / UC Course  I have reviewed the triage vital signs and the nursing notes.  Pertinent labs & imaging results that were available during my care of the patient were reviewed by me and considered in my medical decision making (see chart for details).      Final Clinical Impressions(s) / UC Diagnoses   Final diagnoses:  Acute midline low back pain without sciatica  Encounter to obtain excuse from work     Discharge Instructions     Return as needed   ED Prescriptions    None     PDMP not reviewed this encounter.   Raylene Everts, MD 07/18/19 [phone]

## 2019-07-19 DIAGNOSIS — Z992 Dependence on renal dialysis: Secondary | ICD-10-CM | POA: Diagnosis not present

## 2019-07-19 DIAGNOSIS — N186 End stage renal disease: Secondary | ICD-10-CM | POA: Diagnosis not present

## 2019-07-19 DIAGNOSIS — N2581 Secondary hyperparathyroidism of renal origin: Secondary | ICD-10-CM | POA: Diagnosis not present

## 2019-07-22 DIAGNOSIS — N2581 Secondary hyperparathyroidism of renal origin: Secondary | ICD-10-CM | POA: Diagnosis not present

## 2019-07-22 DIAGNOSIS — N186 End stage renal disease: Secondary | ICD-10-CM | POA: Diagnosis not present

## 2019-07-22 DIAGNOSIS — Z992 Dependence on renal dialysis: Secondary | ICD-10-CM | POA: Diagnosis not present

## 2019-07-24 DIAGNOSIS — N186 End stage renal disease: Secondary | ICD-10-CM | POA: Diagnosis not present

## 2019-07-24 DIAGNOSIS — Z992 Dependence on renal dialysis: Secondary | ICD-10-CM | POA: Diagnosis not present

## 2019-07-24 DIAGNOSIS — N2581 Secondary hyperparathyroidism of renal origin: Secondary | ICD-10-CM | POA: Diagnosis not present

## 2019-07-26 DIAGNOSIS — N186 End stage renal disease: Secondary | ICD-10-CM | POA: Diagnosis not present

## 2019-07-26 DIAGNOSIS — Z992 Dependence on renal dialysis: Secondary | ICD-10-CM | POA: Diagnosis not present

## 2019-07-26 DIAGNOSIS — N2581 Secondary hyperparathyroidism of renal origin: Secondary | ICD-10-CM | POA: Diagnosis not present

## 2019-07-27 DIAGNOSIS — E1022 Type 1 diabetes mellitus with diabetic chronic kidney disease: Secondary | ICD-10-CM | POA: Diagnosis not present

## 2019-07-27 DIAGNOSIS — N186 End stage renal disease: Secondary | ICD-10-CM | POA: Diagnosis not present

## 2019-07-27 DIAGNOSIS — Z992 Dependence on renal dialysis: Secondary | ICD-10-CM | POA: Diagnosis not present

## 2019-08-27 DIAGNOSIS — E1022 Type 1 diabetes mellitus with diabetic chronic kidney disease: Secondary | ICD-10-CM | POA: Diagnosis not present

## 2019-08-27 DIAGNOSIS — N186 End stage renal disease: Secondary | ICD-10-CM | POA: Diagnosis not present

## 2019-08-27 DIAGNOSIS — Z992 Dependence on renal dialysis: Secondary | ICD-10-CM | POA: Diagnosis not present

## 2019-09-24 DIAGNOSIS — N186 End stage renal disease: Secondary | ICD-10-CM | POA: Diagnosis not present

## 2019-09-24 DIAGNOSIS — Z992 Dependence on renal dialysis: Secondary | ICD-10-CM | POA: Diagnosis not present

## 2019-09-24 DIAGNOSIS — E1022 Type 1 diabetes mellitus with diabetic chronic kidney disease: Secondary | ICD-10-CM | POA: Diagnosis not present

## 2019-10-25 DIAGNOSIS — E1022 Type 1 diabetes mellitus with diabetic chronic kidney disease: Secondary | ICD-10-CM | POA: Diagnosis not present

## 2019-10-25 DIAGNOSIS — N186 End stage renal disease: Secondary | ICD-10-CM | POA: Diagnosis not present

## 2019-10-25 DIAGNOSIS — Z992 Dependence on renal dialysis: Secondary | ICD-10-CM | POA: Diagnosis not present

## 2019-11-24 DIAGNOSIS — Z992 Dependence on renal dialysis: Secondary | ICD-10-CM | POA: Diagnosis not present

## 2019-11-24 DIAGNOSIS — N186 End stage renal disease: Secondary | ICD-10-CM | POA: Diagnosis not present

## 2019-11-24 DIAGNOSIS — E1022 Type 1 diabetes mellitus with diabetic chronic kidney disease: Secondary | ICD-10-CM | POA: Diagnosis not present

## 2020-02-23 ENCOUNTER — Other Ambulatory Visit (HOSPITAL_COMMUNITY): Payer: Self-pay | Admitting: Nephrology

## 2020-02-23 DIAGNOSIS — N186 End stage renal disease: Secondary | ICD-10-CM

## 2020-02-23 NOTE — Consult Note (Signed)
Chief Complaint: Pain with dialysis  Referring Physician(s): Dr. Lenna Sciara Deterding  Supervising Physician: Sandi Mariscal  Patient Status: [facility] - Out-pt  History of Present Illness: William Pittman is a 54 y.o. male  History of ESRD on HD with a 1st stage basilic vein transposition to the left arm in 7.16.19 and second stage on 9.4.19 with a verbal report a stent placement by cardiovascular surgery at OSH. Patient reporting pain with dialysis and was unable to be dialysis on 7.30.21. Last dialysis session 7.28.21  Team is concerned the AVF is clotted.  No recent doppler. NKDA allergies. Team is requesting fistulagram with possible intervention .   Past Medical History:  Diagnosis Date  . Anemia   . Chronic kidney disease   . Diabetes mellitus without complication ([place])    Type II  . Hematuria   . Hyperlipidemia   . Hyperparathyroidism ([place])   . Hypertension   . Proteinuria   . Tuberculosis    20 years ago    Past Surgical History:  Procedure Laterality Date  . AV FISTULA PLACEMENT Left 02/08/2018   Procedure: BASCILIC VEIN TRANSPOSITION 1ST STAGE;  Surgeon: Waynetta Sandy, MD;  Location: [place];  Service: Vascular;  Laterality: Left;  . BASCILIC VEIN TRANSPOSITION Left 03/30/2018   Procedure: BASILIC VEIN TRANSPOSITION SECOND STAGE;  Surgeon: Waynetta Sandy, MD;  Location: [place];  Service: Vascular;  Laterality: Left;  . COLONOSCOPY    . CYST EXCISION     arm  . EYE SURGERY Bilateral 2015    Allergies: Patient has no known allergies.  Medications: Prior to Admission medications   Medication Sig Start Date End Date Taking? Authorizing Provider  amLODipine (NORVASC) 10 MG tablet Take 10 mg by mouth daily.    [provider]  aspirin 81 MG tablet Take 81 mg by mouth daily.    [provider]  atorvastatin (LIPITOR) 80 MG tablet Take 80 mg by mouth daily.    [provider]  calcitRIOL (ROCALTROL) 0.25 MCG capsule Take 0.25  mcg by mouth daily.    [provider]  cyclobenzaprine (FLEXERIL) 5 MG tablet Take 1 tablet (5 mg total) by mouth 3 (three) times daily as needed for muscle spasms. 07/11/19   Avegno, Darrelyn Hillock, FNP  Dulaglutide (TRULICITY) 1.5 OE/4.2PN SOPN Inject 1.5 mg into the skin once a week. Saturday    [provider]  hydrALAZINE (APRESOLINE) 50 MG tablet Take 50 mg by mouth 2 (two) times daily.    [provider]  hydrocortisone (ANUSOL-HC) 25 MG suppository Place 1 suppository (25 mg total) rectally 2 (two) times daily. 07/13/19   Suzy Bouchard, PA-C  insulin glargine (LANTUS) 100 UNIT/ML injection Inject 40 Units into the skin at bedtime.     [provider]  polyethylene glycol (MIRALAX) 17 g packet Take 17 g by mouth daily. 07/11/19   Avegno, Darrelyn Hillock, FNP  torsemide (DEMADEX) 20 MG tablet Take 40 mg by mouth every morning.     [provider]     Family History  Problem Relation Age of Onset  . Hyperlipidemia Other   . Hypertension Other   . Diabetes Mother   . Healthy Father     Social History   Socioeconomic History  . Marital status: Married    Spouse name: Not on file  . Number of children: Not on file  . Years of education: Not on file  . Highest education level: Not on file  Occupational  History  . Not on file  Tobacco Use  . Smoking status: Never Smoker  . Smokeless tobacco: Never Used  Vaping Use  . Vaping Use: Never used  Substance and Sexual Activity  . Alcohol use: Yes    Comment: occ  . Drug use: Never  . Sexual activity: Not on file  Other Topics Concern  . Not on file  Social History Narrative  . Not on file   Social Determinants of Health   Financial Resource Strain:   . Difficulty of Paying Living Expenses:   Food Insecurity:   . Worried About Charity fundraiser in the Last Year:   . Arboriculturist in the Last Year:   Transportation Needs:   . Film/video editor (Medical):   Marland Kitchen Lack of  Transportation (Non-Medical):   Physical Activity:   . Days of Exercise per Week:   . Minutes of Exercise per Session:   Stress:   . Feeling of Stress :   Social Connections:   . Frequency of Communication with Friends and Family:   . Frequency of Social Gatherings with Friends and Family:   . Attends Religious Services:   . Active Member of Clubs or Organizations:   . Attends Archivist Meetings:   Marland Kitchen Marital Status:     Review of Systems: A 12 point ROS discussed and pertinent positives are indicated in the HPI above.  All other systems are negative.  Review of Systems  Constitutional: Negative for fever.  HENT: Negative for congestion.   Respiratory: Negative for cough and shortness of breath.   Cardiovascular: Negative for chest pain.  Gastrointestinal: Negative for abdominal pain.  Neurological: Negative for headaches.  Psychiatric/Behavioral: Negative for behavioral problems and confusion.    Vital Signs: There were no vitals taken for this visit.  Physical Exam Vitals and nursing note reviewed.  Constitutional:      Appearance: He is well-developed.  HENT:     Head: Normocephalic.  Cardiovascular:     Rate and Rhythm: Normal rate and regular rhythm.     Heart sounds: Normal heart sounds.     Comments: LUE AVF Pulmonary:     Effort: Pulmonary effort is normal.  Musculoskeletal:        General: Normal range of motion.     Cervical back: Normal range of motion.  Skin:    General: Skin is dry.  Neurological:     Mental Status: He is alert and oriented to person, place, and time.     Imaging: No results found.  Labs:  CBC: Recent Labs    07/13/19 1108  WBC 4.8  HGB 12.7*  HCT 37.7*  PLT 393    COAGS: No results for input(s): INR, APTT in the last 8760 hours.  BMP: Recent Labs    07/13/19 1108  NA 134*  K 4.2  CL 92*  CO2 26  GLUCOSE 143*  BUN 75*  CALCIUM 8.9  CREATININE 11.99*  GFRNONAA 4*  GFRAA 5*    LIVER FUNCTION  TESTS: Recent Labs    07/13/19 1108  BILITOT 0.3  AST 12*  ALT 19  ALKPHOS 79  PROT 7.0  ALBUMIN 3.3*    Assessment and Plan:  54 y.o, male outpatient. History of ESRD on HD with a 1st stage basilic vein transposition to the left arm in 7.16.19 and second stage on 9.4.19 with a verbal report a stent placement by cardiovascular surgery at OSH. Patient reporting pain with dialysis  and was unable to be dialysis on 7.30.21. Last dialysis session 7.28.21  Team is concerned the AVF is clotted.  No recent doppler. NKDA allergies. Team is requesting fistulagram with possible intervention .  No recent labs and medications are within acceptable parameters.   IR consulted for possible fistulagram with possible intervention. Case has been reviewed and procedure approved by Dr. Pascal Lux.  Patient tentatively scheduled for 7.31.21.  Team instructed to: Keep Patient to be NPO after midnight Hold prophylactic anticoagulation 24 hours prior to scheduled procedure. Bring a driver.  Patient instructed to come to the ED for registation at 10 am. .   Thank you for this interesting consult.  I greatly enjoyed meeting William Pittman and look forward to participating in their care.  A copy of this report was sent to the requesting provider on this date.  Electronically Signed: Jacqualine Mau, NP 02/23/2020, 4:51 PM   I spent a total of  25 Minutes in face to face in clinical consultation, greater than 50% of which was counseling/coordinating care for fistulagram possible intervention.

## 2020-02-24 ENCOUNTER — Other Ambulatory Visit: Payer: Self-pay

## 2020-02-24 ENCOUNTER — Other Ambulatory Visit (HOSPITAL_COMMUNITY): Payer: Self-pay | Admitting: Nephrology

## 2020-02-24 ENCOUNTER — Ambulatory Visit (HOSPITAL_COMMUNITY)
Admission: RE | Admit: 2020-02-24 | Discharge: 2020-02-24 | Disposition: A | Discharge status: Home / Self Care | Payer: Commercial Managed Care - PPO | Source: Ambulatory Visit | Attending: Nephrology | Admitting: Nephrology

## 2020-02-24 DIAGNOSIS — N186 End stage renal disease: Secondary | ICD-10-CM

## 2020-02-24 DIAGNOSIS — T82868A Thrombosis of vascular prosthetic devices, implants and grafts, initial encounter: Secondary | ICD-10-CM | POA: Diagnosis not present

## 2020-02-24 DIAGNOSIS — Z79899 Other long term (current) drug therapy: Secondary | ICD-10-CM | POA: Diagnosis not present

## 2020-02-24 DIAGNOSIS — Z992 Dependence on renal dialysis: Secondary | ICD-10-CM | POA: Diagnosis not present

## 2020-02-24 DIAGNOSIS — Z7982 Long term (current) use of aspirin: Secondary | ICD-10-CM | POA: Insufficient documentation

## 2020-02-24 DIAGNOSIS — Y841 Kidney dialysis as the cause of abnormal reaction of the patient, or of later complication, without mention of misadventure at the time of the procedure: Secondary | ICD-10-CM | POA: Diagnosis not present

## 2020-02-24 DIAGNOSIS — E785 Hyperlipidemia, unspecified: Secondary | ICD-10-CM | POA: Insufficient documentation

## 2020-02-24 DIAGNOSIS — D649 Anemia, unspecified: Secondary | ICD-10-CM | POA: Diagnosis not present

## 2020-02-24 DIAGNOSIS — E1122 Type 2 diabetes mellitus with diabetic chronic kidney disease: Secondary | ICD-10-CM | POA: Diagnosis not present

## 2020-02-24 DIAGNOSIS — Z794 Long term (current) use of insulin: Secondary | ICD-10-CM | POA: Insufficient documentation

## 2020-02-24 DIAGNOSIS — I120 Hypertensive chronic kidney disease with stage 5 chronic kidney disease or end stage renal disease: Secondary | ICD-10-CM | POA: Insufficient documentation

## 2020-02-24 DIAGNOSIS — E213 Hyperparathyroidism, unspecified: Secondary | ICD-10-CM | POA: Diagnosis not present

## 2020-02-24 HISTORY — PX: IR THROMBECTOMY AV FISTULA W/THROMBOLYSIS/PTA INC/SHUNT/IMG LEFT: IMG6106

## 2020-02-24 HISTORY — PX: IR US GUIDE VASC ACCESS LEFT: IMG2389

## 2020-02-24 MED ORDER — LIDOCAINE HCL 1 % IJ SOLN
INTRAMUSCULAR | Status: AC
  Filled 2020-02-24: qty 20

## 2020-02-24 MED ORDER — HEPARIN BOLUS VIA INFUSION
INTRAVENOUS | Status: AC | PRN
  Administered 2020-02-24: 4000 [IU] via INTRAVENOUS

## 2020-02-24 MED ORDER — LIDOCAINE HCL (PF) 1 % IJ SOLN
INTRAMUSCULAR | Status: AC | PRN
  Administered 2020-02-24: 10 mL

## 2020-02-24 MED ORDER — FENTANYL CITRATE (PF) 100 MCG/2ML IJ SOLN
INTRAMUSCULAR | Status: AC | PRN
  Administered 2020-02-24: 50 ug via INTRAVENOUS

## 2020-02-24 MED ORDER — ALTEPLASE 2 MG IJ SOLR
INTRAMUSCULAR | Status: AC
  Filled 2020-02-24: qty 4

## 2020-02-24 MED ORDER — IOHEXOL 300 MG/ML  SOLN
100.0000 mL | Freq: Once | INTRAMUSCULAR | Status: AC | PRN
  Administered 2020-02-24: 54 mL via INTRAVENOUS

## 2020-02-24 MED ORDER — SODIUM CHLORIDE 0.9 % IV SOLN
INTRAVENOUS | Status: AC | PRN
  Administered 2020-02-24: 4 mg via INTRAVENOUS

## 2020-02-24 MED ORDER — FENTANYL CITRATE (PF) 100 MCG/2ML IJ SOLN
INTRAMUSCULAR | Status: AC
  Filled 2020-02-24: qty 2

## 2020-02-24 MED ORDER — HEPARIN SODIUM (PORCINE) 1000 UNIT/ML IJ SOLN
INTRAMUSCULAR | Status: AC
  Filled 2020-02-24: qty 1

## 2020-02-24 NOTE — Procedures (Signed)
Pre procedural Dx: ESRD Post procedural Dx: Same.   Technically successful declot of left upper arm AVF.   Access is ready for immediate use.  EBL: Trace  Complications: None immediate   Ronny Bacon, MD Pager #: [phone]

## 2020-02-26 ENCOUNTER — Other Ambulatory Visit: Payer: Self-pay | Admitting: Student

## 2020-02-26 ENCOUNTER — Other Ambulatory Visit (HOSPITAL_COMMUNITY): Payer: Self-pay | Admitting: Nephrology

## 2020-02-26 DIAGNOSIS — T82868A Thrombosis of vascular prosthetic devices, implants and grafts, initial encounter: Secondary | ICD-10-CM

## 2020-02-27 ENCOUNTER — Other Ambulatory Visit (HOSPITAL_COMMUNITY): Payer: Self-pay | Admitting: Nephrology

## 2020-02-27 ENCOUNTER — Other Ambulatory Visit: Payer: Self-pay

## 2020-02-27 ENCOUNTER — Ambulatory Visit (HOSPITAL_COMMUNITY)
Admission: RE | Admit: 2020-02-27 | Discharge: 2020-02-27 | Disposition: A | Discharge status: Home / Self Care | Payer: Commercial Managed Care - PPO | Source: Ambulatory Visit | Attending: Nephrology | Admitting: Nephrology

## 2020-02-27 DIAGNOSIS — D649 Anemia, unspecified: Secondary | ICD-10-CM | POA: Diagnosis not present

## 2020-02-27 DIAGNOSIS — I129 Hypertensive chronic kidney disease with stage 1 through stage 4 chronic kidney disease, or unspecified chronic kidney disease: Secondary | ICD-10-CM | POA: Diagnosis not present

## 2020-02-27 DIAGNOSIS — Z992 Dependence on renal dialysis: Secondary | ICD-10-CM | POA: Insufficient documentation

## 2020-02-27 DIAGNOSIS — T82868A Thrombosis of vascular prosthetic devices, implants and grafts, initial encounter: Secondary | ICD-10-CM | POA: Diagnosis present

## 2020-02-27 DIAGNOSIS — Z79899 Other long term (current) drug therapy: Secondary | ICD-10-CM | POA: Insufficient documentation

## 2020-02-27 DIAGNOSIS — N189 Chronic kidney disease, unspecified: Secondary | ICD-10-CM | POA: Diagnosis not present

## 2020-02-27 DIAGNOSIS — Z7982 Long term (current) use of aspirin: Secondary | ICD-10-CM | POA: Insufficient documentation

## 2020-02-27 DIAGNOSIS — Y841 Kidney dialysis as the cause of abnormal reaction of the patient, or of later complication, without mention of misadventure at the time of the procedure: Secondary | ICD-10-CM | POA: Insufficient documentation

## 2020-02-27 DIAGNOSIS — Z794 Long term (current) use of insulin: Secondary | ICD-10-CM | POA: Diagnosis not present

## 2020-02-27 DIAGNOSIS — E785 Hyperlipidemia, unspecified: Secondary | ICD-10-CM | POA: Insufficient documentation

## 2020-02-27 DIAGNOSIS — E213 Hyperparathyroidism, unspecified: Secondary | ICD-10-CM | POA: Diagnosis not present

## 2020-02-27 DIAGNOSIS — E1122 Type 2 diabetes mellitus with diabetic chronic kidney disease: Secondary | ICD-10-CM | POA: Diagnosis not present

## 2020-02-27 HISTORY — PX: IR THROMBECTOMY AV FISTULA W/THROMBOLYSIS/PTA/STENT INC/SHUNT/IMG LT: IMG6107

## 2020-02-27 HISTORY — PX: IR US GUIDE VASC ACCESS LEFT: IMG2389

## 2020-02-27 LAB — GLUCOSE, CAPILLARY
Glucose-Capillary: 124 mg/dL — ABNORMAL HIGH (ref 70–99)
Glucose-Capillary: 124 mg/dL — ABNORMAL HIGH (ref 70–99)

## 2020-02-27 MED ORDER — ALTEPLASE 2 MG IJ SOLR
INTRAMUSCULAR | Status: AC
  Filled 2020-02-27: qty 4

## 2020-02-27 MED ORDER — HEPARIN SODIUM (PORCINE) 1000 UNIT/ML IJ SOLN
INTRAMUSCULAR | Status: AC | PRN
  Administered 2020-02-27: 3000 [IU] via INTRAVENOUS

## 2020-02-27 MED ORDER — MIDAZOLAM HCL 2 MG/2ML IJ SOLN
INTRAMUSCULAR | Status: AC | PRN
  Administered 2020-02-27: 1 mg via INTRAVENOUS

## 2020-02-27 MED ORDER — SODIUM CHLORIDE 0.9 % IV SOLN: INTRAVENOUS | Status: DC

## 2020-02-27 MED ORDER — FENTANYL CITRATE (PF) 100 MCG/2ML IJ SOLN
INTRAMUSCULAR | Status: AC | PRN
  Administered 2020-02-27: 50 ug via INTRAVENOUS

## 2020-02-27 MED ORDER — HYDRALAZINE HCL 20 MG/ML IJ SOLN
10.0000 mg | Freq: Once | INTRAMUSCULAR | Status: AC
  Administered 2020-02-27: 10 mg via INTRAVENOUS

## 2020-02-27 MED ORDER — ALTEPLASE 30 MG/30 ML FOR INTERV. RAD
INTRA_ARTERIAL | Status: AC | PRN
  Administered 2020-02-27: 2.5 mg via INTRA_ARTERIAL
  Administered 2020-02-27: 1.5 mg via INTRA_ARTERIAL

## 2020-02-27 MED ORDER — IOHEXOL 300 MG/ML  SOLN
100.0000 mL | Freq: Once | INTRAMUSCULAR | Status: AC | PRN
  Administered 2020-02-27: 50 mL via INTRAVENOUS

## 2020-02-27 MED ORDER — FENTANYL CITRATE (PF) 100 MCG/2ML IJ SOLN
INTRAMUSCULAR | Status: AC
  Filled 2020-02-27: qty 2

## 2020-02-27 MED ORDER — HYDRALAZINE HCL 20 MG/ML IJ SOLN
INTRAMUSCULAR | Status: AC
  Filled 2020-02-27: qty 1

## 2020-02-27 MED ORDER — MIDAZOLAM HCL 2 MG/2ML IJ SOLN
INTRAMUSCULAR | Status: AC
  Filled 2020-02-27: qty 2

## 2020-02-27 MED ORDER — HEPARIN SODIUM (PORCINE) 1000 UNIT/ML IJ SOLN
INTRAMUSCULAR | Status: AC
  Filled 2020-02-27: qty 1

## 2020-02-27 MED ORDER — LIDOCAINE HCL 1 % IJ SOLN
INTRAMUSCULAR | Status: AC
  Filled 2020-02-27: qty 20

## 2020-02-27 MED ORDER — LIDOCAINE HCL 1 % IJ SOLN
INTRAMUSCULAR | Status: AC | PRN
  Administered 2020-02-27: 5 mL

## 2020-02-27 NOTE — Discharge Instructions (Signed)

## 2020-02-27 NOTE — Procedures (Signed)
Interventional Radiology Procedure Note  Procedure: US guided access x2 LUE fistula, for mechanical/pharm thrombectomy and stenting of permissive lesion at the central edge of prior gore viabahn stent.  New stent 29mm x 75mm. .  Findings: 80% stenosis pre, 0% residual post Excellent thrill at the conclusion  Complications: None  Recommendations:  - Ok to use fistula - DC in 1 hr - Routine wound care  - z-stitches may be removed in 48 hrs at next dialysis session - Early duplex is recommended to evaluate the fistula for any silent lesion that may be alternative permissive problem, given the short interval of patency after recent intervention  Signed,  Jaquell Seddon S. Earleen Newport, DO

## 2020-02-27 NOTE — H&P (Signed)
Chief Complaint: Patient was seen in consultation today for fistulogram with possible intervention.   Referring Physician(s): [place]  Supervising Physician: Corrie Mckusick  Patient Status: [facility] - Out-pt  History of Present Illness: William Pittman is a 54 y.o. male with a medical history that includes DM, HTN, a remote history of tuberculosis and chronic kidney disease on hemodialysis. Patient had been reporting pain with dialysis and was recently seen by IR 02/24/20 for a declot of his left upper arm AVF. The patient had a hemodialysis session yesterday, 02/26/20, which only ran about an hour because his fistula stopped working.   Interventional  Radiology has been asked to evaluate this patient for a fistulogram with possible intervention.     Past Medical History:  Diagnosis Date  . Anemia   . Chronic kidney disease   . Diabetes mellitus without complication ([place])    Type II  . Hematuria   . Hyperlipidemia   . Hyperparathyroidism ([place])   . Hypertension   . Proteinuria   . Tuberculosis    20 years ago    Past Surgical History:  Procedure Laterality Date  . AV FISTULA PLACEMENT Left 02/08/2018   Procedure: BASCILIC VEIN TRANSPOSITION 1ST STAGE;  Surgeon: Waynetta Sandy, MD;  Location: [facility];  Service: Vascular;  Laterality: Left;  . BASCILIC VEIN TRANSPOSITION Left 03/30/2018   Procedure: BASILIC VEIN TRANSPOSITION SECOND STAGE;  Surgeon: Waynetta Sandy, MD;  Location: [facility];  Service: Vascular;  Laterality: Left;  . COLONOSCOPY    . CYST EXCISION     arm  . EYE SURGERY Bilateral 2015  . IR THROMBECTOMY AV FISTULA W/THROMBOLYSIS/PTA INC/SHUNT/IMG LEFT Left 02/24/2020  . IR US GUIDE VASC ACCESS LEFT  02/24/2020    Allergies: Patient has no known allergies.  Medications: Prior to Admission medications   Medication Sig Start Date End Date Taking? Authorizing Provider  amLODipine (NORVASC) 10 MG tablet Take 10 mg by mouth daily.   Yes  [provider]  aspirin 81 MG tablet Take 81 mg by mouth daily.   Yes [provider]  atorvastatin (LIPITOR) 80 MG tablet Take 80 mg by mouth daily.   Yes [provider]  calcitRIOL (ROCALTROL) 0.25 MCG capsule Take 0.25 mcg by mouth daily.   Yes [provider]  cyclobenzaprine (FLEXERIL) 5 MG tablet Take 1 tablet (5 mg total) by mouth 3 (three) times daily as needed for muscle spasms. 07/11/19  Yes Avegno, Darrelyn Hillock, FNP  Dulaglutide (TRULICITY) 1.5 CL/2.7NT SOPN Inject 1.5 mg into the skin once a week. Saturday   Yes [provider]  hydrALAZINE (APRESOLINE) 50 MG tablet Take 50 mg by mouth 2 (two) times daily.   Yes [provider]  hydrocortisone (ANUSOL-HC) 25 MG suppository Place 1 suppository (25 mg total) rectally 2 (two) times daily. 07/13/19  Yes Aberman, Caroline C, PA-C  insulin glargine (LANTUS) 100 UNIT/ML injection Inject 40 Units into the skin at bedtime.    Yes [provider]  polyethylene glycol (MIRALAX) 17 g packet Take 17 g by mouth daily. 07/11/19  Yes Avegno, Darrelyn Hillock, FNP  torsemide (DEMADEX) 20 MG tablet Take 40 mg by mouth every morning.    Yes [provider]     Family History  Problem Relation Age of Onset  . Hyperlipidemia Other   . Hypertension Other   . Diabetes Mother   . Healthy Father     Social History   Socioeconomic History  . Marital status: Married  Spouse name: Not on file  . Number of children: Not on file  . Years of education: Not on file  . Highest education level: Not on file  Occupational History  . Not on file  Tobacco Use  . Smoking status: Never Smoker  . Smokeless tobacco: Never Used  Vaping Use  . Vaping Use: Never used  Substance and Sexual Activity  . Alcohol use: Yes    Comment: occ  . Drug use: Never  . Sexual activity: Not on file  Other Topics Concern  . Not on file  Social History Narrative  . Not on file   Social Determinants of  Health   Financial Resource Strain:   . Difficulty of Paying Living Expenses:   Food Insecurity:   . Worried About Charity fundraiser in the Last Year:   . Arboriculturist in the Last Year:   Transportation Needs:   . Film/video editor (Medical):   Marland Kitchen Lack of Transportation (Non-Medical):   Physical Activity:   . Days of Exercise per Week:   . Minutes of Exercise per Session:   Stress:   . Feeling of Stress :   Social Connections:   . Frequency of Communication with Friends and Family:   . Frequency of Social Gatherings with Friends and Family:   . Attends Religious Services:   . Active Member of Clubs or Organizations:   . Attends Archivist Meetings:   Marland Kitchen Marital Status:     Review of Systems: A 12 point ROS discussed and pertinent positives are indicated in the HPI above.  All other systems are negative.  Review of Systems  Constitutional: Negative for fever.  Respiratory: Negative for cough and shortness of breath.   Cardiovascular: Negative for chest pain and leg swelling.  Gastrointestinal: Negative for abdominal pain, diarrhea, nausea and vomiting.  Musculoskeletal: Negative for back pain.  Neurological: Negative for headaches.    Vital Signs: BP (!) 217/103   Pulse 77   Temp 98.6 F (37 C) (Oral)   Resp 17   Ht '5\' 6"'  (1.676 m)   Wt 171 lb 15.3 oz (78 kg)   SpO2 100%   BMI 27.75 kg/m   Physical Exam Constitutional:      General: He is not in acute distress. HENT:     Mouth/Throat:     Mouth: Mucous membranes are moist.     Pharynx: Oropharynx is clear.  Cardiovascular:     Rate and Rhythm: Normal rate and regular rhythm.     Pulses: Normal pulses.     Heart sounds: Normal heart sounds.     Comments: Left upper arm AVF, no thrill or bruit detected.  Pulmonary:     Effort: Pulmonary effort is normal.     Breath sounds: Normal breath sounds.  Abdominal:     General: Abdomen is flat.     Palpations: Abdomen is soft.  Musculoskeletal:         General: Normal range of motion.     Cervical back: Normal range of motion.  Skin:    General: Skin is warm and dry.  Neurological:     Mental Status: He is alert and oriented to person, place, and time.     Imaging: IR US Guide Vasc Access Left  Result Date: 02/24/2020 INDICATION: Clotted left upper arm dialysis fistula. Patient's fistula was created approximately 2 years ago. Patient states he underwent venous stenting at [facility] approximately 2 months ago. Otherwise,  the fistula has performed well however when he presented for dialysis yesterday the fistula is noted to be clotted. As such, he presents today for attempted image guided thrombectomy. EXAM: 1. MECHANICAL AND PHARMACOLOGICAL FISTULALYSIS WITH THE USE OF AN ANGIOJET DEVICE 2. ANGIOPLASTY OF VENOUS LIMB AND VENOUS ANASTOMOSIS 3. ULTRASOUND GUIDANCE FOR VENOUS ACCESS COMPARISON:  None. - This is the first intervention on the patient's left upper arm AV fistula at this institution MEDICATIONS: Heparin 4000 units IV; TPA 4 mg into graft. CONTRAST:  50 cc Omnipaque 300 ANESTHESIA/SEDATION: Moderate (conscious) sedation was employed during this procedure. A total of Fentanyl 50 mcg was administered intravenously. Moderate Sedation Time: 54 minutes. The patient's level of consciousness and vital signs were monitored continuously by radiology nursing throughout the procedure under my direct supervision. FLUOROSCOPY TIME:  9 minutes, 54 seconds (25 mGy) COMPLICATIONS: None immediate. TECHNIQUE: Informed written consent was obtained from the patient after a discussion of the risks, benefits and alternatives to treatment. Questions regarding the procedure were encouraged and answered. A timeout was performed prior to the initiation of the procedure. On physical examination, the existing left upper arm AV graft was negative for palpable pulse or thrill. Sonographic evaluation was performed of the fistula demonstrating complete occlusion of  the fistula from the level of the anastomosis through the central aspect of the venous outflow stent. The skin overlying the left upper arm fistula was prepped and draped in the usual sterile fashion, and a sterile drape was applied covering the operative field. Maximum barrier sterile technique with sterile gowns and gloves were used for the procedure. Under ultrasound guidance, the fistula was accessed directed towards the venous outflow with a micropuncture kit after the overlying soft tissues were anesthetized with 1% lidocaine. An ultrasound image was saved for documentation purposes. The micropuncture sheath was exchange for a 7-French vascular sheath over a guidewire. Over a Benson wire, a Kumpe catheter was advanced centrally and a central venogram was performed. Pull back venogram was performed with the Kumpe catheter. Heparin was administered systemically and TPA was administered via the Kumpe catheter throughout near the entirety of the venous limb. Next, mechanical thrombectomy was performed with the use of the Angiojet device followed by angioplasty of the majority the venous outflow with a 6 mm x 4 cm Conquest balloon. An additional access was obtained directed towards the anastomoses with a micropuncture kit after the overlying soft tissues anesthetized with 1% lidocaine. This allowed for placement of a 6-French vascular sheath. The graft was then thrombectomized with several rounds of push-pull mechanical thrombectomy with an occlusion balloon. Flow was restored to the graft as evidenced by restored pulsatility of the graft and brisk blood return from the side arm of the vascular sheath. Fistulagram images were then performed from the native arterial system. Two areas of mild (approximately 40%) luminal narrowing involving the arterial limb of the fistula within angioplastied with the 6 mm x 4 cm Conquest balloon. Completion fistulagram images were obtained both from the native arterial system as  well as via the venous outflow directed vascular sheath. Images were reviewed and the procedure was terminated. All wires, catheters and sheaths were removed from the patient. Hemostasis was achieved at both access sites with deployment of a swizzle sutures which will be removed at the patient's next dialysis session. Dressings were placed. The patient tolerated the procedure without immediate postprocedural complication. FINDINGS: On physical examination, the left upper arm brachial-basilic AV fistula is thrombosed without a palpable pulse. Sonographic evaluation of  the fistula demonstrates thrombosis from the level of the anastomosis to the central aspect of the venous outflow stent. There is no significant aneurysmal dilatation of the fistula and as such the decision was made to proceed with attempted image guided thrombectomy. Following pharmacological and mechanical thrombectomy with the use of a Angiojet device, angioplasty balloon and Fogarty balloon, brisk flow was restored to the AV fistula. Completion images demonstrate a technically excellent result without evidence of a residual hemodynamically significant narrowing or mural thrombus. There is no evidence complication, specifically, no evidence of contrast extravasation or vessel dissection. The central venous system, arterial limb and arterial anastomosis are widely patent. IMPRESSION: 1. Technically successful left upper arm brachial-basilic AV fistula thrombolysis. 2. Successful angioplasty of the near the entirety of the AV fistula to 6 mm diameter. 3. Completion fistulagram images demonstrate wide patency of the AV fistula without evidence of a residual hemodynamically significant narrowing or mural thrombus. 4. The arterial anastomosis and arterial limb are widely patent. 5. The central venous system of the left upper extremity is widely patent. ACCESS: This AV fistula would be amenable to future percutaneous intervention as clinically indicated  though early recurrent thrombosis (within 2 months), would likely necessitate abandonment of the AV fistula. Electronically Signed   By: Sandi Mariscal M.D.   On: 02/24/2020 15:49   IR THROMBECTOMY AV FISTULA W/THROMBOLYSIS/PTA INC/SHUNT/IMG LEFT  Result Date: 02/24/2020 INDICATION: Clotted left upper arm dialysis fistula. Patient's fistula was created approximately 2 years ago. Patient states he underwent venous stenting at [facility] approximately 2 months ago. Otherwise, the fistula has performed well however when he presented for dialysis yesterday the fistula is noted to be clotted. As such, he presents today for attempted image guided thrombectomy. EXAM: 1. MECHANICAL AND PHARMACOLOGICAL FISTULALYSIS WITH THE USE OF AN ANGIOJET DEVICE 2. ANGIOPLASTY OF VENOUS LIMB AND VENOUS ANASTOMOSIS 3. ULTRASOUND GUIDANCE FOR VENOUS ACCESS COMPARISON:  None. - This is the first intervention on the patient's left upper arm AV fistula at this institution MEDICATIONS: Heparin 4000 units IV; TPA 4 mg into graft. CONTRAST:  50 cc Omnipaque 300 ANESTHESIA/SEDATION: Moderate (conscious) sedation was employed during this procedure. A total of Fentanyl 50 mcg was administered intravenously. Moderate Sedation Time: 54 minutes. The patient's level of consciousness and vital signs were monitored continuously by radiology nursing throughout the procedure under my direct supervision. FLUOROSCOPY TIME:  9 minutes, 54 seconds (25 mGy) COMPLICATIONS: None immediate. TECHNIQUE: Informed written consent was obtained from the patient after a discussion of the risks, benefits and alternatives to treatment. Questions regarding the procedure were encouraged and answered. A timeout was performed prior to the initiation of the procedure. On physical examination, the existing left upper arm AV graft was negative for palpable pulse or thrill. Sonographic evaluation was performed of the fistula demonstrating complete occlusion of the fistula from  the level of the anastomosis through the central aspect of the venous outflow stent. The skin overlying the left upper arm fistula was prepped and draped in the usual sterile fashion, and a sterile drape was applied covering the operative field. Maximum barrier sterile technique with sterile gowns and gloves were used for the procedure. Under ultrasound guidance, the fistula was accessed directed towards the venous outflow with a micropuncture kit after the overlying soft tissues were anesthetized with 1% lidocaine. An ultrasound image was saved for documentation purposes. The micropuncture sheath was exchange for a 7-French vascular sheath over a guidewire. Over a Benson wire, a Kumpe catheter was advanced centrally  and a central venogram was performed. Pull back venogram was performed with the Kumpe catheter. Heparin was administered systemically and TPA was administered via the Kumpe catheter throughout near the entirety of the venous limb. Next, mechanical thrombectomy was performed with the use of the Angiojet device followed by angioplasty of the majority the venous outflow with a 6 mm x 4 cm Conquest balloon. An additional access was obtained directed towards the anastomoses with a micropuncture kit after the overlying soft tissues anesthetized with 1% lidocaine. This allowed for placement of a 6-French vascular sheath. The graft was then thrombectomized with several rounds of push-pull mechanical thrombectomy with an occlusion balloon. Flow was restored to the graft as evidenced by restored pulsatility of the graft and brisk blood return from the side arm of the vascular sheath. Fistulagram images were then performed from the native arterial system. Two areas of mild (approximately 40%) luminal narrowing involving the arterial limb of the fistula within angioplastied with the 6 mm x 4 cm Conquest balloon. Completion fistulagram images were obtained both from the native arterial system as well as via the  venous outflow directed vascular sheath. Images were reviewed and the procedure was terminated. All wires, catheters and sheaths were removed from the patient. Hemostasis was achieved at both access sites with deployment of a swizzle sutures which will be removed at the patient's next dialysis session. Dressings were placed. The patient tolerated the procedure without immediate postprocedural complication. FINDINGS: On physical examination, the left upper arm brachial-basilic AV fistula is thrombosed without a palpable pulse. Sonographic evaluation of the fistula demonstrates thrombosis from the level of the anastomosis to the central aspect of the venous outflow stent. There is no significant aneurysmal dilatation of the fistula and as such the decision was made to proceed with attempted image guided thrombectomy. Following pharmacological and mechanical thrombectomy with the use of a Angiojet device, angioplasty balloon and Fogarty balloon, brisk flow was restored to the AV fistula. Completion images demonstrate a technically excellent result without evidence of a residual hemodynamically significant narrowing or mural thrombus. There is no evidence complication, specifically, no evidence of contrast extravasation or vessel dissection. The central venous system, arterial limb and arterial anastomosis are widely patent. IMPRESSION: 1. Technically successful left upper arm brachial-basilic AV fistula thrombolysis. 2. Successful angioplasty of the near the entirety of the AV fistula to 6 mm diameter. 3. Completion fistulagram images demonstrate wide patency of the AV fistula without evidence of a residual hemodynamically significant narrowing or mural thrombus. 4. The arterial anastomosis and arterial limb are widely patent. 5. The central venous system of the left upper extremity is widely patent. ACCESS: This AV fistula would be amenable to future percutaneous intervention as clinically indicated though early  recurrent thrombosis (within 2 months), would likely necessitate abandonment of the AV fistula. Electronically Signed   By: Sandi Mariscal M.D.   On: 02/24/2020 15:49    Labs:  CBC: Recent Labs    07/13/19 1108  WBC 4.8  HGB 12.7*  HCT 37.7*  PLT 393    COAGS: No results for input(s): INR, APTT in the last 8760 hours.  BMP: Recent Labs    07/13/19 1108  NA 134*  K 4.2  CL 92*  CO2 26  GLUCOSE 143*  BUN 75*  CALCIUM 8.9  CREATININE 11.99*  GFRNONAA 4*  GFRAA 5*    LIVER FUNCTION TESTS: Recent Labs    07/13/19 1108  BILITOT 0.3  AST 12*  ALT 19  ALKPHOS 79  PROT 7.0  ALBUMIN 3.3*    TUMOR MARKERS: No results for input(s): AFPTM, CEA, CA199, CHROMGRNA in the last 8760 hours.  Assessment and Plan:  Malfunctioning left AVF: William Pittman, 54 year old male, presents today to the [facility] department for a left AV fistulogram with possible intervention.  Risks and benefits discussed with the patient including, but not limited to bleeding, infection, vascular injury, pulmonary embolism, need for tunneled HD catheter placement or even death.  All of the patient's questions were answered, patient is agreeable to proceed.  Patient has been NPO. 10 mg IV hydralazine ordered pre-procedure for a blood pressure of 217/103.  Consent signed and in chart.  Thank you for this interesting consult.  I greatly enjoyed meeting Dexton N Yeakle and look forward to participating in their care.  A copy of this report was sent to the requesting provider on this date.  Electronically Signed: Soyla Dryer, AGACNP-BC [phone] 02/27/2020, 8:04 AM   I spent a total of  30 Minutes   in face to face in clinical consultation, greater than 50% of which was counseling/coordinating care for AV fistulogram with possible intervention.

## 2021-01-09 ENCOUNTER — Other Ambulatory Visit: Payer: Self-pay

## 2021-01-09 ENCOUNTER — Ambulatory Visit (HOSPITAL_COMMUNITY)
Admission: EM | Admit: 2021-01-09 | Discharge: 2021-01-09 | Disposition: A | Discharge status: Home / Self Care | Payer: Commercial Managed Care - PPO | Attending: Physician Assistant | Admitting: Physician Assistant

## 2021-01-09 ENCOUNTER — Encounter (HOSPITAL_COMMUNITY): Payer: Self-pay

## 2021-01-09 DIAGNOSIS — U071 COVID-19: Secondary | ICD-10-CM

## 2021-01-09 MED ORDER — BENZONATATE 200 MG PO CAPS: 200.0000 mg | ORAL_CAPSULE | Freq: Three times a day (TID) | ORAL | 0 refills | Status: AC

## 2021-01-09 MED ORDER — PROMETHAZINE-DM 6.25-15 MG/5ML PO SYRP: 2.5000 mL | ORAL_SOLUTION | Freq: Four times a day (QID) | ORAL | 0 refills | Status: AC | PRN

## 2021-01-09 NOTE — ED Triage Notes (Signed)
Pt presents with cough x 5 days. Reports + COVID home test days ago.  Denies chest pain, shortness of breath, fever.

## 2021-01-09 NOTE — Discharge Instructions (Addendum)
Your lungs were clear without signs of pneumonia/bronchitis  Tessalon for cough If needed, add cough syrup, this may make you drowsy.  You can continue claritin  Follow up with PCP if symptoms not improving. If having worsening, cough, fever, shortness of breath, chest pain, follow up for reevaluation.

## 2021-01-09 NOTE — ED Provider Notes (Signed)
[facility]    CSN: [id number] Arrival date & time: 01/09/21  [id number]      History   Chief Complaint Chief Complaint  Patient presents with   Cough    + COVID home test    HPI William Pittman is a 55 y.o. male.   55 year old male comes in for 5 day history of URI symptoms. Had chills, body aches, cough. States now just having residual cough. Cough is nonproductive, worse at night, and occasionally worse during talking. Denies shortness of breath, fever, chest pain. Had home positive COVID test  Dialysis, MWF, finished course yesterday without problems.    Past Medical History:  Diagnosis Date   Anemia    Chronic kidney disease    Diabetes mellitus without complication ([place])    Type II   Hematuria    Hyperlipidemia    Hyperparathyroidism ([place])    Hypertension    Proteinuria    Tuberculosis    20 years ago    There are no problems to display for this patient.   Past Surgical History:  Procedure Laterality Date   AV FISTULA PLACEMENT Left 02/08/2018   Procedure: BASCILIC VEIN TRANSPOSITION 1ST STAGE;  Surgeon: Waynetta Sandy, MD;  Location: [facility];  Service: Vascular;  Laterality: Left;   [place] Left 03/30/2018   Procedure: BASILIC VEIN TRANSPOSITION SECOND STAGE;  Surgeon: Waynetta Sandy, MD;  Location: [facility];  Service: Vascular;  Laterality: Left;   COLONOSCOPY     CYST EXCISION     arm   EYE SURGERY Bilateral 2015   IR THROMBECTOMY AV FISTULA W/THROMBOLYSIS/PTA INC/SHUNT/IMG LEFT Left 02/24/2020   IR THROMBECTOMY AV FISTULA W/THROMBOLYSIS/PTA/STENT INC/SHUNT/IMG LT Left 02/27/2020   IR US GUIDE VASC ACCESS LEFT  02/24/2020   IR US GUIDE VASC ACCESS LEFT  02/27/2020       Home Medications    Prior to Admission medications   Medication Sig Start Date End Date Taking? Authorizing Provider  benzonatate (TESSALON) 200 MG capsule Take 1 capsule (200 mg total) by mouth every 8 (eight) hours. 01/09/21  Yes Porschea Borys  V, PA-C  promethazine-dextromethorphan (PROMETHAZINE-DM) 6.25-15 MG/5ML syrup Take 2.5 mLs by mouth 4 (four) times daily as needed for cough. 01/09/21  Yes Jerri Hargadon V, PA-C  amLODipine (NORVASC) 10 MG tablet Take 10 mg by mouth daily.    [provider]  aspirin 81 MG tablet Take 81 mg by mouth daily.    [provider]  calcitRIOL (ROCALTROL) 0.25 MCG capsule Take 0.25 mcg by mouth daily.    [provider]  Dulaglutide (TRULICITY) 1.5 0000000 SOPN Inject 1.5 mg into the skin once a week. Saturday    [provider]  insulin glargine (LANTUS) 100 UNIT/ML injection Inject 40 Units into the skin at bedtime.     [provider]  torsemide (DEMADEX) 20 MG tablet Take 40 mg by mouth every morning.     [provider]    Family History Family History  Problem Relation Age of Onset   Hyperlipidemia Other    Hypertension Other    Diabetes Mother    Healthy Father     Social History Social History   Tobacco Use   Smoking status: Never   Smokeless tobacco: Never  Vaping Use   Vaping Use: Never used  Substance Use Topics   Alcohol use: Yes    Comment: occ   Drug use: Never     Allergies  Patient has no known allergies.   Review of Systems Review of Systems  Reason unable to perform ROS: See HPI as above.    Physical Exam Triage Vital Signs ED Triage Vitals  Enc Vitals Group     BP 01/09/21 0957 (!) 160/85     Pulse Rate 01/09/21 0957 88     Resp 01/09/21 0957 18     Temp 01/09/21 0957 99 F (37.2 C)     Temp Source 01/09/21 0957 Oral     SpO2 01/09/21 0957 99 %     Weight --      Height --      Head Circumference --      Peak Flow --      Pain Score 01/09/21 0954 0     Pain Loc --      Pain Edu? --      Excl. in [place]? --    No data found.  Updated Vital Signs BP (!) 160/85 (BP Location: Right Arm)   Pulse 88   Temp 99 F (37.2 C) (Oral)   Resp 18   SpO2 99%   Physical Exam Constitutional:       General: He is not in acute distress.    Appearance: He is well-developed. He is not ill-appearing, toxic-appearing or diaphoretic.  HENT:     Head: Normocephalic and atraumatic.     Right Ear: Tympanic membrane, ear canal and external ear normal. Tympanic membrane is not erythematous or bulging.     Left Ear: Tympanic membrane, ear canal and external ear normal. Tympanic membrane is not erythematous or bulging.     Nose:     Right Sinus: No maxillary sinus tenderness or frontal sinus tenderness.     Left Sinus: No maxillary sinus tenderness or frontal sinus tenderness.     Mouth/Throat:     Mouth: Mucous membranes are moist.     Pharynx: Oropharynx is clear. Uvula midline.  Eyes:     Conjunctiva/sclera: Conjunctivae normal.     Pupils: Pupils are equal, round, and reactive to light.  Cardiovascular:     Rate and Rhythm: Normal rate and regular rhythm.  Pulmonary:     Effort: Pulmonary effort is normal. No accessory muscle usage, prolonged expiration, respiratory distress or retractions.     Breath sounds: No decreased air movement or transmitted upper airway sounds. No decreased breath sounds.     Comments: LCTAB Musculoskeletal:     Cervical back: Normal range of motion and neck supple.  Skin:    General: Skin is warm and dry.  Neurological:     Mental Status: He is alert and oriented to person, place, and time.     UC Treatments / Results  Labs (all labs ordered are listed, but only abnormal results are displayed) Labs Reviewed - No data to display  EKG   Radiology No results found.  Procedures Procedures (including critical care time)  Medications Ordered in UC Medications - No data to display  Initial Impression / Assessment and Plan / UC Course  I have reviewed the triage vital signs and the nursing notes.  Pertinent labs & imaging results that were available during my care of the patient were reviewed by me and considered in my medical decision making (see  chart for details).    55 year old male who is on dialysis comes in for 5-day history of URI symptoms after positive COVID test at home.  He denies fever, shortness of breath. LCTAB. Will treat symptomatically  for now. Return precautions given. Patient expresses understanding and agrees to plan.  Final Clinical Impressions(s) / UC Diagnoses   Final diagnoses:  [id number]    ED Prescriptions     Medication Sig Dispense Auth. Provider   benzonatate (TESSALON) 200 MG capsule Take 1 capsule (200 mg total) by mouth every 8 (eight) hours. 21 capsule Lucas Winograd V, PA-C   promethazine-dextromethorphan (PROMETHAZINE-DM) 6.25-15 MG/5ML syrup Take 2.5 mLs by mouth 4 (four) times daily as needed for cough. 118 mL Tasia Catchings, Katie Moch V, PA-C      I have reviewed the PDMP during this encounter.   Ok Edwards, PA-C 01/09/21 1023

## 2021-04-22 ENCOUNTER — Other Ambulatory Visit: Payer: Self-pay | Admitting: Nephrology

## 2021-04-22 DIAGNOSIS — N2889 Other specified disorders of kidney and ureter: Secondary | ICD-10-CM

## 2021-04-29 ENCOUNTER — Ambulatory Visit
Admission: RE | Admit: 2021-04-29 | Discharge: 2021-04-29 | Disposition: A | Discharge status: Home / Self Care | Payer: Commercial Managed Care - PPO | Source: Ambulatory Visit | Attending: Nephrology | Admitting: Nephrology

## 2021-04-29 DIAGNOSIS — N2889 Other specified disorders of kidney and ureter: Secondary | ICD-10-CM

## 2021-08-10 IMAGING — US IR THROMBECTOMY AV FISTULA W/THROMBOLYSIS/PTA/STENT INC/SHUNT/IM
1 series · 2 of 2 positions shown · non-contrast
Comparison: none

INDICATION: 54-year-old male with a history of thrombosed left upper extremity
fistula

[Series 1: ir thrombectomy av fistula w/thrombolysis/pta/sten · 2 of 2 slices shown]
[im 1/2]
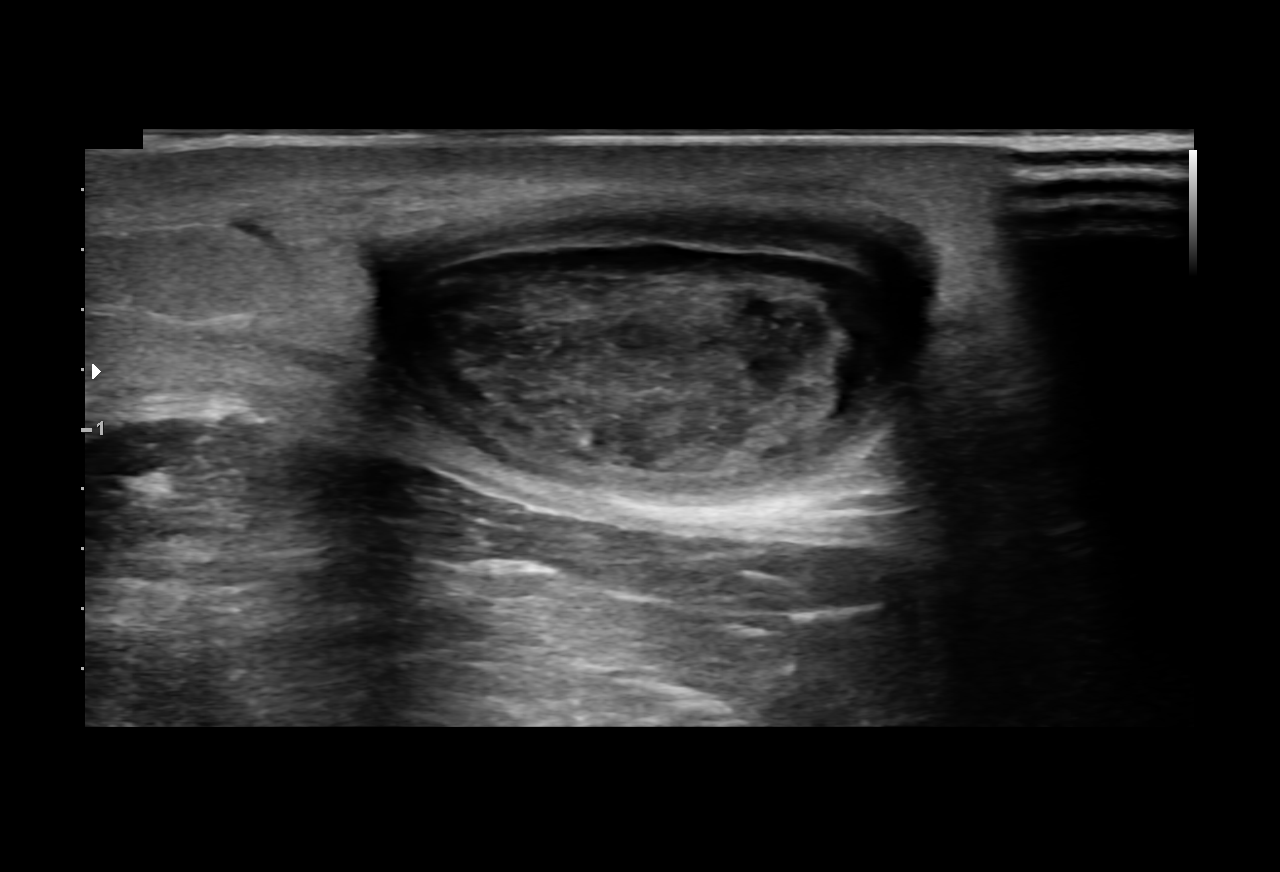
[im 2/2]
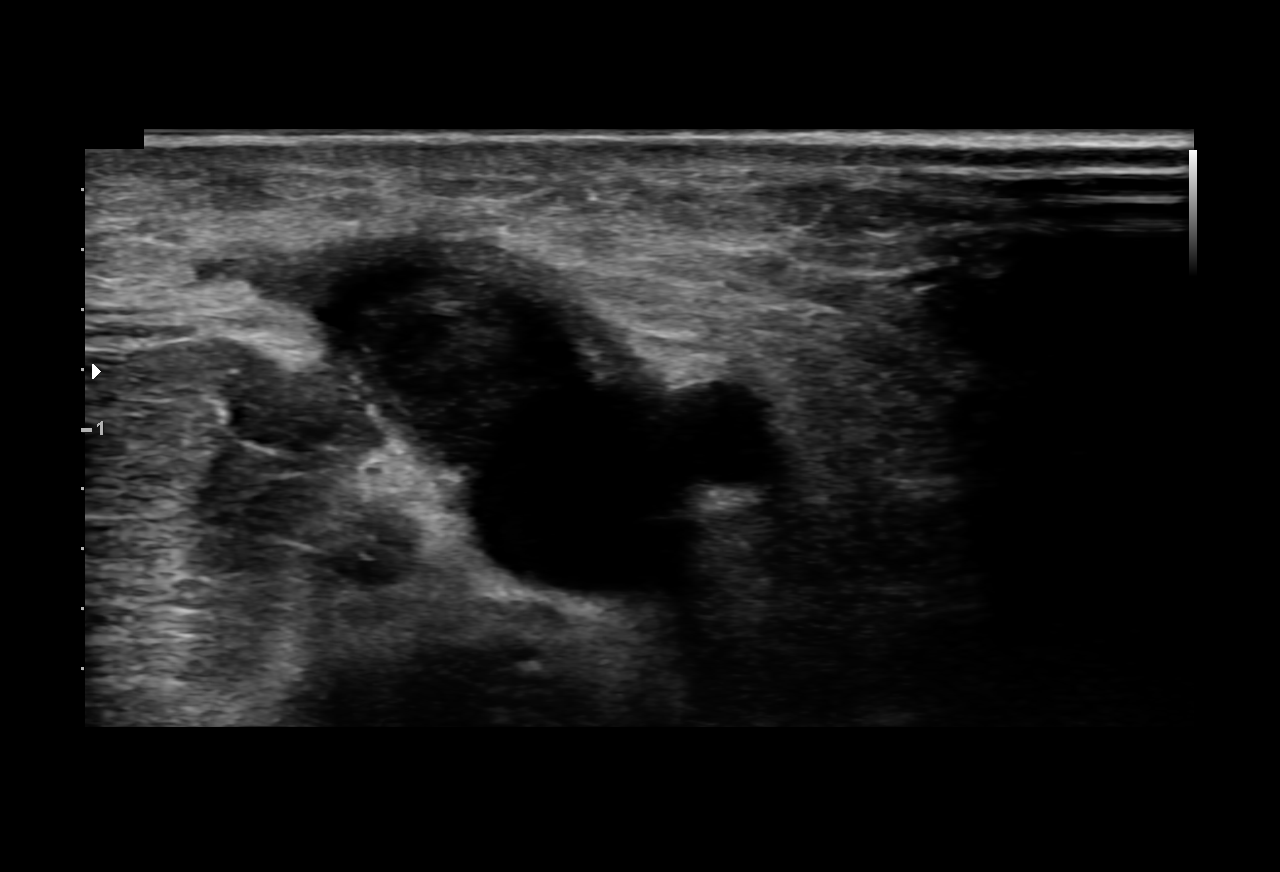

[2 of 2 positions shown; findings below may reference images not displayed]

EXAM:
ULTRASOUND GUIDED ACCESS LEFT UPPER EXTREMITY FISTULA X2

MECHANICAL AND PHARMACOLOGIC THROMBECTOMY

BALLOON ANGIOPLASTY AND STENTING OF OUTFLOW STENOSIS AT THE CENTRAL
EDGE OF A PRIOR COVERED STENT

MEDICATIONS:
None.

ANESTHESIA/SEDATION:
Moderate Sedation Time:  43 MINUTES

The patient was continuously monitored during the procedure by the
interventional radiology nurse under my direct supervision.

FLUOROSCOPY TIME:  Fluoroscopy Time: 6 minutes 18 seconds (21 mGy).

COMPLICATIONS:
None

PROCEDURE:
The procedure, risks, benefits, and alternatives were explained to
the patient and the patient's family, including bleeding, infection,
arterial thrombus, venous thrombus, vessel injury, need for further
procedure, need for stenting, contrast reaction, need for catheter
placement, cardiopulmonary collapse, death. Questions regarding the
procedure were encouraged and answered. The patient understands and
consents to the procedure.

Ultrasound survey was performed with images stored and sent to PACs.

The left upper extremity was prepped and draped in the usual sterile
fashion.

Ultrasound guidance was used to access the upper extremity graft
with a micropuncture kit. Exchange was made for a 7 French short
sheath.

A Bentson wire was then a navigate into the venous outflow, with an
angled catheter advanced into the venous outflow.

Venogram of the left upper extremity demonstrates patency of the
axillary and subclavian vein outflow.

Upon withdrawal of the angled catheter, a solution of 2 milligrams
of tPA in saline was infused into the outflow portion of the graft.

A 6 mm balloon catheter was used to macerate the thrombosed segment.

Balloon angioplasty of the stenotic venous segment was performed
with 6 mm balloon angioplasty at the central edge of the Langan
viabahn stent of the upper arm.

Ultrasound guidance was then used access the fistula directed toward
the arterial anastomosis. Once the micro sheath was in place, an
exchange was made for a 6 French short sheath.

Glidewire was navigated across the arterial anastomosis, with the
angle catheter advanced to the proximal fistula. 2 mg of tPA in
solution was administered in the proximal fistula. Over the wire
Fogarty balloon was then advanced into the artery. The arterial plug
was pulled, 2 times sequentially. Angiogram was then performed
confirming restoration of flow through the fistula.

We then turned our attention to the permissive lesion at the central
edge of the Langan vibe on of the upper arm. Repeat angiogram at this
location demonstrates the typical stenosis that is encountered with
these covered stents.

The 7 French sheath was upsized to a 8 French sheath. A 9 mm by 40
mm Covera stent was deployed across the stenosis. Post angioplasty
was performed with 9 mm x 40 mm Mevlane Mabboux balloon to 24 atmospheres.

Repeat fistulogram was then performed, including a reflux image.

Excellent thrill was confirmed.

Catheters and wires were removed with a complete fistulagram
performed through the central vasculature.

No residual stenosis was identified in the venous outflow. No
residual stenosis at the arterial anastomosis.

The short sheaths were removed after placement of hemostasis
sutures.

Patient tolerated the procedure well and remained hemodynamically
stable throughout.

No complications were encountered and no significant blood loss was
encountered.
FINDINGS: Initial physical exam confirms no pulsatility or or thrill through
the thrombosed fistula.

Upon initial wire placement directed centrally, there was difficulty
navigating the diagnostic catheter and the Bentson wire across the
central edge of the previously present stent of the upper arm,
suggesting a permissive lesion at this edge.

After restoration of flow was achieved through the fistula, we
confirmed with angiogram that there was a permissive lesion at this
stent.

After treatment with 9 mm x 40 mm covered stent in this segment,
there is restoration of excellent flow and restoration of excellent
thrill.

No stenosis at the arterial anastomosis.

Pre stenosis was greater than 80% at the edge of the prior stent.

Post stenosis was 0%.
IMPRESSION: Status post ultrasound guided access left upper extremity fistula
for mechanical and pharmacologic thrombectomy of thrombosed fistula,
with treatment of a high-grade permissive lesion at the proximal
edge of the indwelling stent with placement of 9 mm covered stent.
Restoration of excellent flow and excellent thrill upon completion.

ACCESS:
This access remains amenable to future percutaneous interventions as
clinically indicated.
